# Patient Record
Sex: Female | Born: 1935 | Race: Black or African American | Hispanic: No | Marital: Married | Smoking: Former smoker
Health system: Southern US, Community
[De-identification: ages and names within clinical notes are randomized; demographics above are authoritative.]

## PROBLEM LIST (undated history)

## (undated) DIAGNOSIS — I872 Venous insufficiency (chronic) (peripheral): Secondary | ICD-10-CM

## (undated) DIAGNOSIS — I482 Chronic atrial fibrillation, unspecified: Secondary | ICD-10-CM

## (undated) DIAGNOSIS — G459 Transient cerebral ischemic attack, unspecified: Secondary | ICD-10-CM

## (undated) DIAGNOSIS — I119 Hypertensive heart disease without heart failure: Secondary | ICD-10-CM

## (undated) DIAGNOSIS — E785 Hyperlipidemia, unspecified: Secondary | ICD-10-CM

## (undated) DIAGNOSIS — I10 Essential (primary) hypertension: Secondary | ICD-10-CM

## (undated) DIAGNOSIS — Z952 Presence of prosthetic heart valve: Secondary | ICD-10-CM

## (undated) DIAGNOSIS — I639 Cerebral infarction, unspecified: Secondary | ICD-10-CM

## (undated) DIAGNOSIS — R609 Edema, unspecified: Secondary | ICD-10-CM

## (undated) DIAGNOSIS — I2510 Atherosclerotic heart disease of native coronary artery without angina pectoris: Secondary | ICD-10-CM

## (undated) HISTORY — DX: Hypertensive heart disease without heart failure: I11.9

## (undated) HISTORY — DX: Hyperlipidemia, unspecified: E78.5

## (undated) HISTORY — PX: CARDIAC CATHETERIZATION: SHX172

## (undated) HISTORY — PX: DILATION AND CURETTAGE OF UTERUS: SHX78

## (undated) HISTORY — DX: Presence of prosthetic heart valve: Z95.2

## (undated) HISTORY — PX: MITRAL VALVE REPLACEMENT: SHX147

## (undated) HISTORY — DX: Chronic atrial fibrillation, unspecified: I48.20

## (undated) HISTORY — DX: Transient cerebral ischemic attack, unspecified: G45.9

## (undated) HISTORY — DX: Edema, unspecified: R60.9

## (undated) HISTORY — DX: Venous insufficiency (chronic) (peripheral): I87.2

---

## 1997-12-09 ENCOUNTER — Other Ambulatory Visit: Admission: RE | Admit: 1997-12-09 | Discharge: 1997-12-09 | Payer: Self-pay | Admitting: Obstetrics & Gynecology

## 1999-01-14 ENCOUNTER — Other Ambulatory Visit: Admission: RE | Admit: 1999-01-14 | Discharge: 1999-01-14 | Payer: Self-pay | Admitting: Obstetrics and Gynecology

## 2000-01-05 ENCOUNTER — Other Ambulatory Visit: Admission: RE | Admit: 2000-01-05 | Discharge: 2000-01-05 | Payer: Self-pay | Admitting: Obstetrics and Gynecology

## 2000-05-14 ENCOUNTER — Inpatient Hospital Stay (HOSPITAL_COMMUNITY): Admission: EM | Admit: 2000-05-14 | Discharge: 2000-05-21 | Payer: Self-pay | Admitting: Emergency Medicine

## 2000-05-14 ENCOUNTER — Encounter: Payer: Self-pay | Admitting: Emergency Medicine

## 2000-05-16 ENCOUNTER — Encounter: Payer: Self-pay | Admitting: Neurosurgery

## 2000-05-17 ENCOUNTER — Encounter: Payer: Self-pay | Admitting: Pediatrics

## 2000-05-18 ENCOUNTER — Encounter: Payer: Self-pay | Admitting: Pediatrics

## 2000-05-31 ENCOUNTER — Encounter: Payer: Self-pay | Admitting: *Deleted

## 2000-05-31 ENCOUNTER — Ambulatory Visit (HOSPITAL_COMMUNITY): Admission: RE | Admit: 2000-05-31 | Discharge: 2000-05-31 | Payer: Self-pay | Admitting: *Deleted

## 2001-01-09 ENCOUNTER — Other Ambulatory Visit: Admission: RE | Admit: 2001-01-09 | Discharge: 2001-01-09 | Payer: Self-pay | Admitting: Obstetrics and Gynecology

## 2001-08-15 ENCOUNTER — Emergency Department (HOSPITAL_COMMUNITY): Admission: EM | Admit: 2001-08-15 | Discharge: 2001-08-15 | Payer: Self-pay | Admitting: Emergency Medicine

## 2002-01-10 ENCOUNTER — Other Ambulatory Visit: Admission: RE | Admit: 2002-01-10 | Discharge: 2002-01-10 | Payer: Self-pay | Admitting: Obstetrics and Gynecology

## 2002-11-11 ENCOUNTER — Inpatient Hospital Stay (HOSPITAL_COMMUNITY): Admission: EM | Admit: 2002-11-11 | Discharge: 2002-11-13 | Payer: Self-pay | Admitting: Emergency Medicine

## 2002-11-11 ENCOUNTER — Encounter: Payer: Self-pay | Admitting: Emergency Medicine

## 2002-11-12 ENCOUNTER — Encounter (HOSPITAL_BASED_OUTPATIENT_CLINIC_OR_DEPARTMENT_OTHER): Payer: Self-pay | Admitting: Internal Medicine

## 2002-11-23 ENCOUNTER — Ambulatory Visit (HOSPITAL_COMMUNITY): Admission: RE | Admit: 2002-11-23 | Discharge: 2002-11-23 | Payer: Self-pay | Admitting: Cardiology

## 2003-02-26 ENCOUNTER — Other Ambulatory Visit: Admission: RE | Admit: 2003-02-26 | Discharge: 2003-02-26 | Payer: Self-pay | Admitting: Obstetrics and Gynecology

## 2004-05-12 ENCOUNTER — Other Ambulatory Visit: Admission: RE | Admit: 2004-05-12 | Discharge: 2004-05-12 | Payer: Self-pay | Admitting: Obstetrics and Gynecology

## 2005-06-10 ENCOUNTER — Other Ambulatory Visit: Admission: RE | Admit: 2005-06-10 | Discharge: 2005-06-10 | Payer: Self-pay | Admitting: Obstetrics and Gynecology

## 2011-09-09 ENCOUNTER — Other Ambulatory Visit: Payer: Self-pay | Admitting: Cardiology

## 2011-09-09 ENCOUNTER — Ambulatory Visit
Admission: RE | Admit: 2011-09-09 | Discharge: 2011-09-09 | Disposition: A | Discharge status: Home / Self Care | Payer: Medicare Other | Source: Ambulatory Visit | Attending: Cardiology | Admitting: Cardiology

## 2011-09-09 DIAGNOSIS — R609 Edema, unspecified: Secondary | ICD-10-CM

## 2011-09-28 ENCOUNTER — Ambulatory Visit (INDEPENDENT_AMBULATORY_CARE_PROVIDER_SITE_OTHER): Payer: Medicare Other | Admitting: *Deleted

## 2011-09-28 DIAGNOSIS — R609 Edema, unspecified: Secondary | ICD-10-CM

## 2011-10-01 NOTE — Procedures (Unsigned)
DUPLEX DEEP VENOUS EXAM - LOWER EXTREMITY  INDICATION:  Edema  HISTORY:  Edema:  Bilateral lower edema that fluctuates throughout the course of the day Trauma/Surgery:  No Pain:  No PE:  No Previous DVT:  No Anticoagulants: Other:  DUPLEX EXAM:               CFV   SFV   PopV  PTV    GSV               R  L  R  L  R  L  R   L  R  L Thrombosis    o  o  o  o  o  o  o   o  o  o Spontaneous   +  +  +  +  +  +  +   +  +  + Phasic        +  +  +  +  +  +  +   +  +  + Augmentation  +  +  +  +  +  +  +   +  +  + Compressible  +  +  +  +  +  +  +   +  +  + Competent     o  o  +  o  +  +  +   +  +  +  Legend:  + - yes  o - no  p - partial  D - decreased  IMPRESSION: 1. No evidence of deep or superficial vein thrombosis noted in the     bilateral lower extremities. 2. Reflux of >500 milliseconds noted in the bilateral common femoral     and left superficial femoral veins.   _____________________________ Janetta Hora Fields, MD  CH/MEDQ  D:  09/28/2011  T:  09/28/2011  Job:  [id number]

## 2013-05-12 IMAGING — CR DG CHEST 2V
2 series · 2 of 2 positions shown · non-contrast
Comparison: None.

CLINICAL DATA: Heart surgery 9667.  Possible pulmonary edema.

CHEST - 2 VIEW

[view not recorded (1 of 2)]
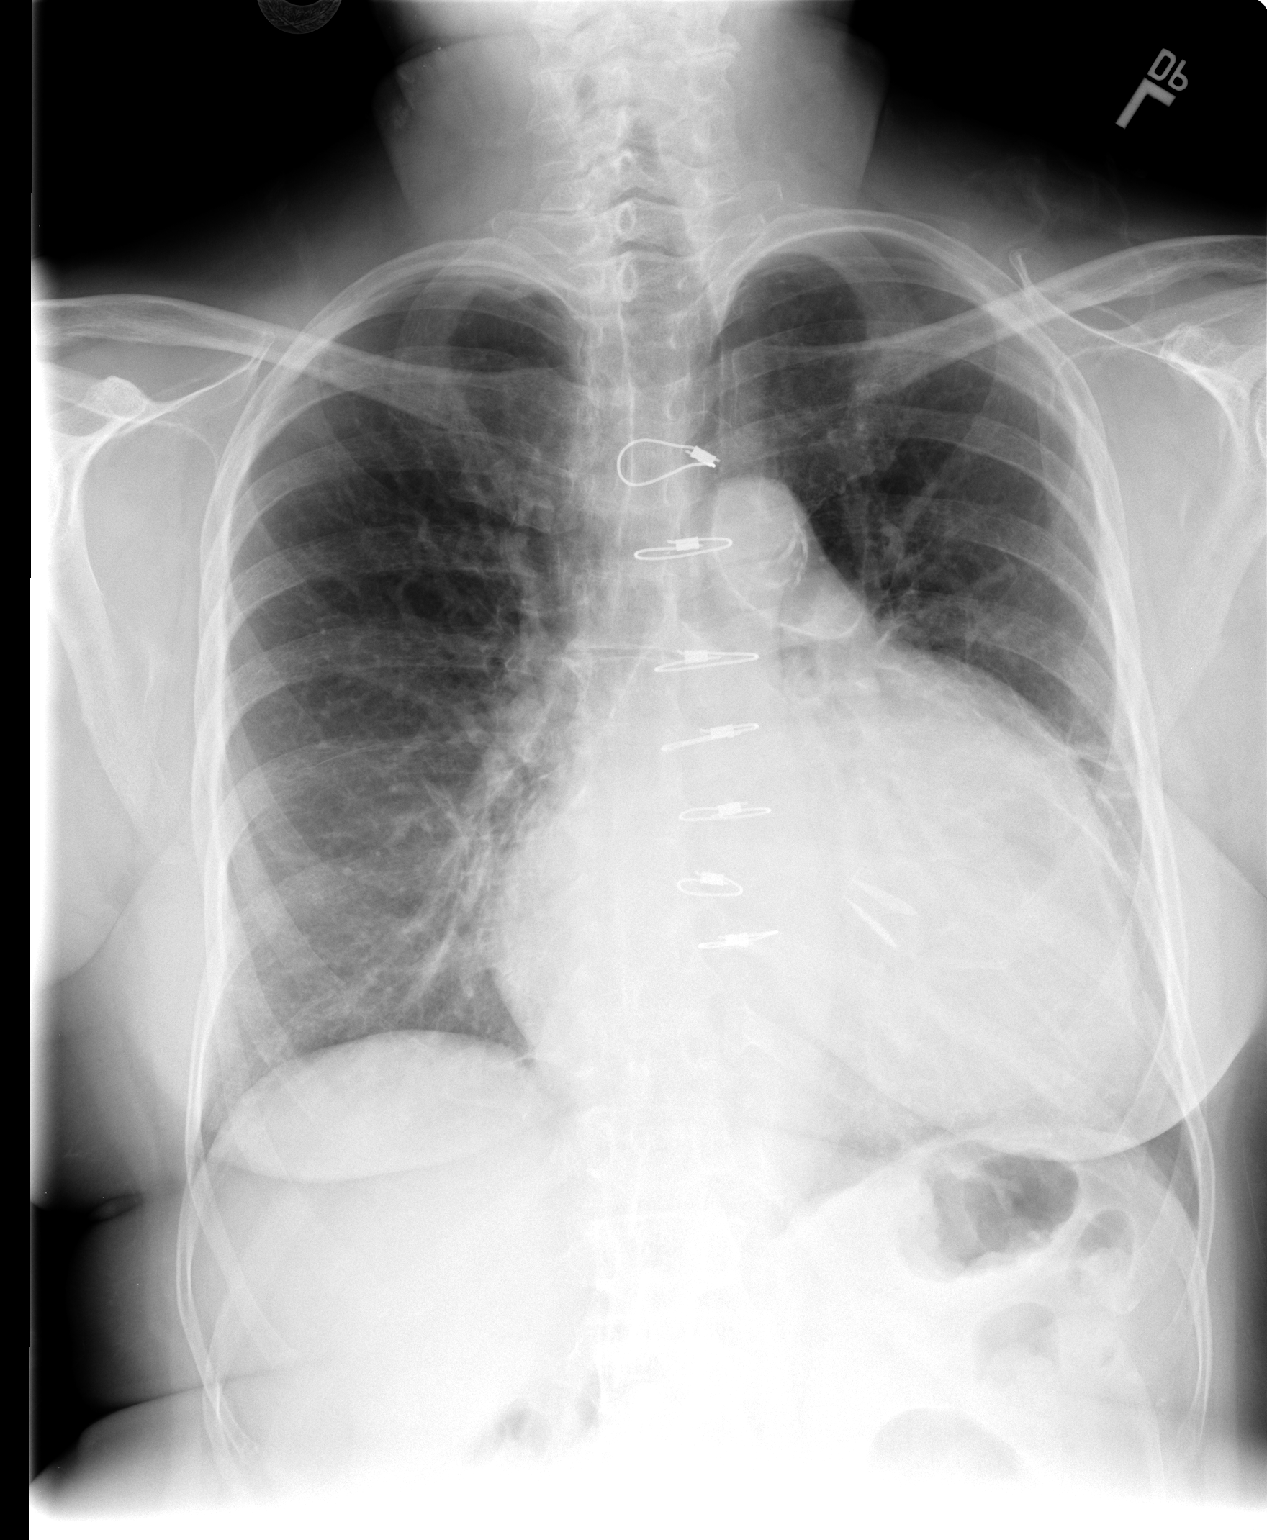

[view not recorded (2 of 2)]
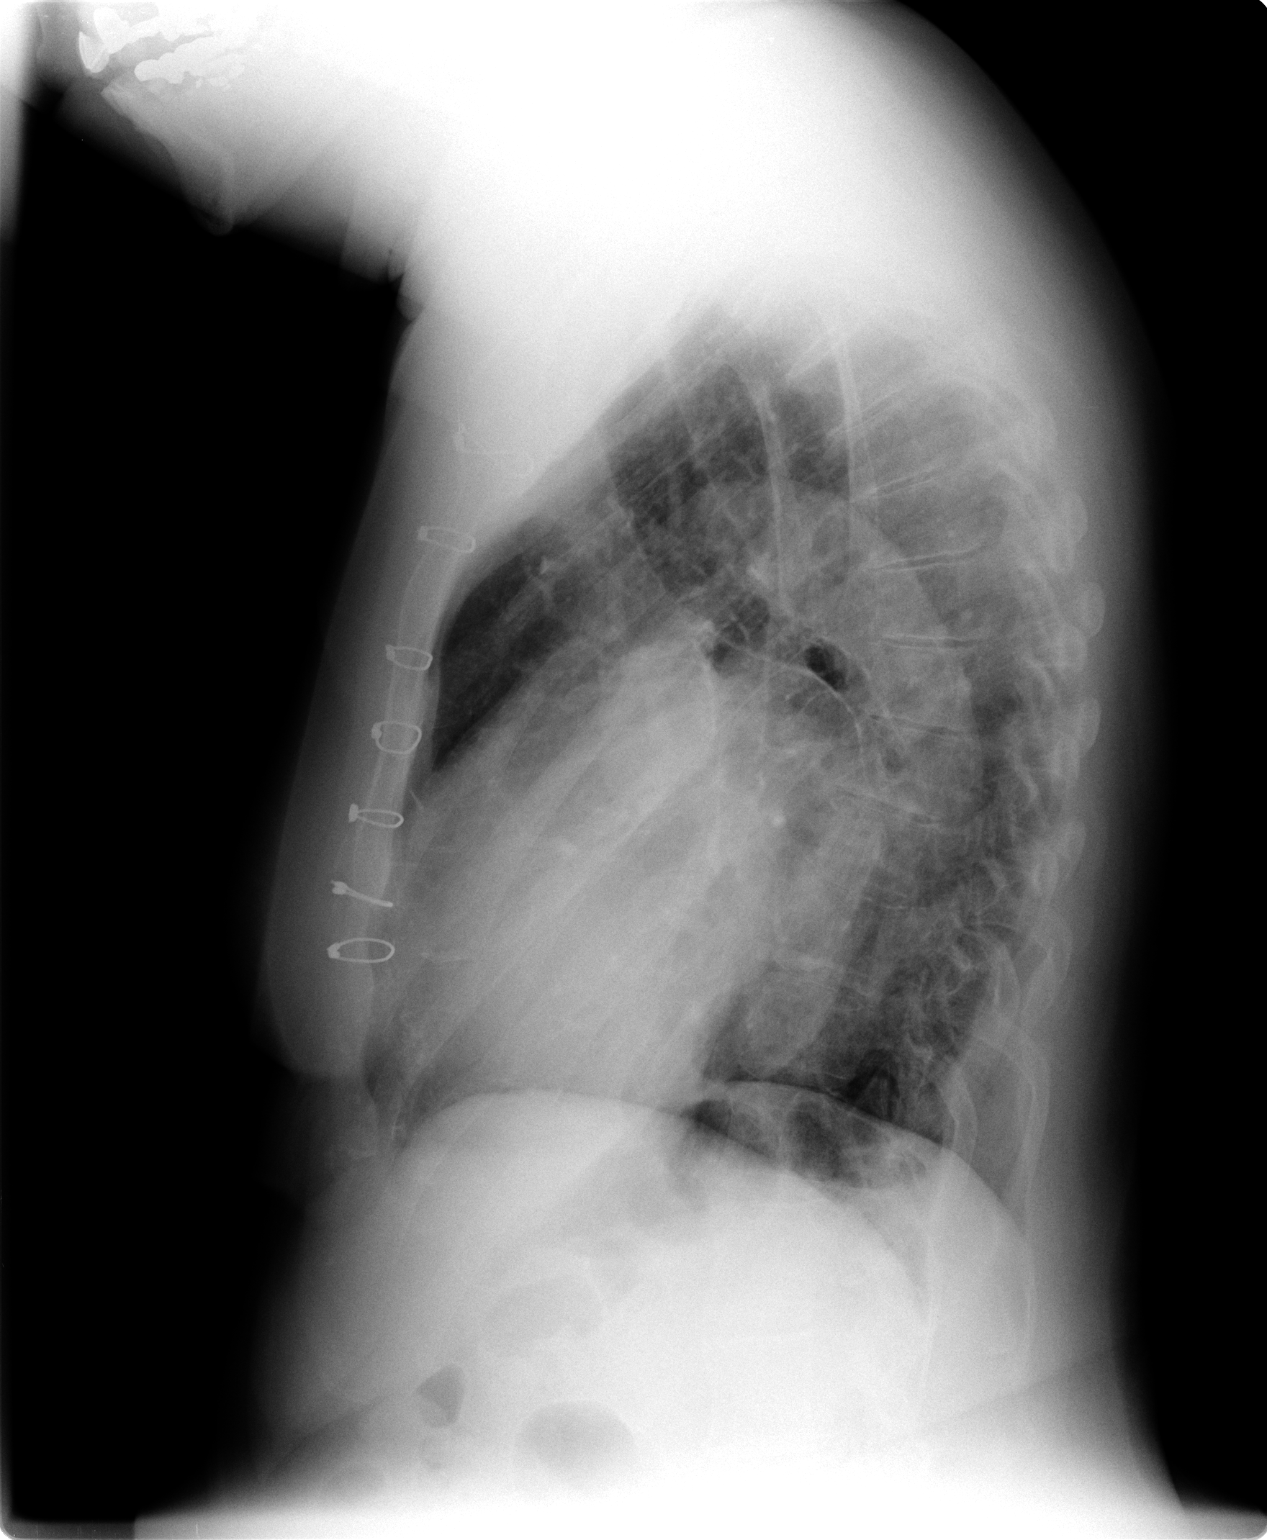

[2 of 2 positions shown; findings below may reference images not displayed]

FINDINGS: Prior median sternotomy.  Lateral view is mildly oblique.

Midline trachea.  Moderate to marked global cardiomegaly.
Mediastinal contours otherwise within normal limits.  No pleural
effusion or pneumothorax.  No lobar consolidation.  Mild lower lobe
predominant pulmonary interstitial thickening.  More focal scarring
in the left midlung on the frontal view laterally.
IMPRESSION: Moderate to marked cardiomegaly.  Mild lower lobe predominant
interstitial thickening, suspicious for pulmonary venous
congestion.  No overt congestive failure.

## 2016-11-29 ENCOUNTER — Encounter: Payer: Self-pay | Admitting: Cardiology

## 2018-03-28 ENCOUNTER — Other Ambulatory Visit: Payer: Self-pay

## 2018-03-28 DIAGNOSIS — E785 Hyperlipidemia, unspecified: Secondary | ICD-10-CM

## 2018-03-28 DIAGNOSIS — Z952 Presence of prosthetic heart valve: Secondary | ICD-10-CM

## 2018-03-28 DIAGNOSIS — R609 Edema, unspecified: Secondary | ICD-10-CM

## 2018-03-28 DIAGNOSIS — I872 Venous insufficiency (chronic) (peripheral): Secondary | ICD-10-CM

## 2018-03-28 DIAGNOSIS — I119 Hypertensive heart disease without heart failure: Secondary | ICD-10-CM

## 2018-03-28 DIAGNOSIS — I482 Chronic atrial fibrillation, unspecified: Secondary | ICD-10-CM

## 2018-03-28 DIAGNOSIS — G459 Transient cerebral ischemic attack, unspecified: Secondary | ICD-10-CM | POA: Insufficient documentation

## 2018-03-28 HISTORY — DX: Hyperlipidemia, unspecified: E78.5

## 2018-03-28 HISTORY — DX: Chronic atrial fibrillation, unspecified: I48.20

## 2018-03-28 HISTORY — DX: Venous insufficiency (chronic) (peripheral): I87.2

## 2018-03-28 HISTORY — DX: Presence of prosthetic heart valve: Z95.2

## 2018-03-28 HISTORY — DX: Edema, unspecified: R60.9

## 2018-03-28 HISTORY — DX: Transient cerebral ischemic attack, unspecified: G45.9

## 2018-03-28 HISTORY — DX: Hypertensive heart disease without heart failure: I11.9

## 2018-05-21 NOTE — Progress Notes (Signed)
Cardiology Office Note:    Date:  05/22/2018   ID:  Miranda Parsons, DOB 11/23/1934, MRN [id number]  PCP:  Jarome MatinPaterson, Daniel, MD  Cardiologist:  Norman HerrlichBrian Gregery Walberg, MD    Referring MD: Jarome MatinPaterson, Daniel, MD    ASSESSMENT:    1. Chronic atrial fibrillation   2. Chronic anticoagulation   3. CAD in native artery   4. Hypertensive heart disease without heart failure   5. Pure hypercholesterolemia    PLAN:    In order of problems listed above:  1. Stable asymptomatic continue beta-blocker and warfarin INR goal 3.0 2. Stable continue warfarin with Saint Jude MVR managed with her PCP 3. Stable she has had no anginal discomfort I would not pursue an ischemia evaluation and continue current treatment including beta-blocker warfarin and high intensity statin 4. Stable controlled with home measurements continue current treatment with her loop diuretic and beta-blocker. 5. Stable lipids are ideal continue her current high intensity statin.  Labs for toxicity liver function follow-up with her PCP   Next appointment: 6 months Dr. Donnie Ahoilley   Medication Adjustments/Labs and Tests Ordered: Current medicines are reviewed at length with the patient today.  Concerns regarding medicines are outlined above.  Orders Placed This Encounter  Procedures  . EKG 12-Lead   No orders of the defined types were placed in this encounter.   Chief Complaint  Patient presents with  . Follow-up  . Atrial Fibrillation  . Anticoagulation    History of Present Illness:    Miranda Parsons is a 83 y.o. female with a hx of chronic atrial fibrillation on warfarin with Saint Jude mitral valve for rheumatic mitral valve disease hypertension without heart failure although she takes a diuretic CAD and hyper lipidemia.[name].  She was last seen Dr. Donnie Ahoilley 6 months ago. Compliance with diet, lifestyle and medications: Yes  She has had a frustrating day and started off going to a new doctor new office and had a flat tire her  blood pressure is elevated but she tells me excellent range at home.  She is unaware of atrial fibrillation occasionally his valve clicking has had no shortness of breath chest pain palpitation TIA or bleeding complication of her anticoagulant she has intermittent dependent edema sodium restriction takes a loop diuretic.  INR is followed with her PCP and her last and last month was 2.6.  Lipid panel 01/13/2018 shows a cholesterol 138 HDL 50 LDL 78 renal function normal. Past Medical History:  Diagnosis Date  . Chronic atrial fibrillation 03/28/2018  . Edema 03/28/2018  . Hyperlipidemia 03/28/2018  . Hypertensive heart disease without heart failure 03/28/2018  . Presence of prosthetic heart valve 03/28/2018  . TIA (transient ischemic attack) 03/28/2018  . Venous insufficiency 03/28/2018    Past Surgical History:  Procedure Laterality Date  . CARDIAC CATHETERIZATION    . DILATION AND CURETTAGE OF UTERUS    . MITRAL VALVE REPLACEMENT      Current Medications: Current Meds  Medication Sig  . alendronate (FOSAMAX) 70 MG tablet Take 70 mg by mouth once a week. Take with a full glass of water on an empty stomach.  [name]. amoxicillin (AMOXIL) 500 MG tablet Take 500 mg by mouth 2 (two) times daily. Take prior to dental procedures  . atenolol (TENORMIN) 25 MG tablet Take 1 tablet by mouth daily.  [name]. atorvastatin (LIPITOR) 80 MG tablet Take 80 mg by mouth daily.  . Calcium 500-125 MG-UNIT TABS Take 0.5 tablets by mouth daily.   [name]. docusate  sodium (COLACE) 100 MG capsule Take 100 mg by mouth 2 (two) times daily.  . ergocalciferol (VITAMIN D2) 50000 units capsule Take 50,000 Units by mouth once a week.  . furosemide (LASIX) 40 MG tablet Take 40 mg by mouth daily.   . Horse Chestnut 300 MG CAPS Take 1 capsule by mouth daily.  [name] losartan (COZAAR) 50 MG tablet Take 50 mg by mouth daily.  . potassium chloride (K-DUR) 10 MEQ tablet Take 10 mEq by mouth 2 (two) times daily.   . vitamin B-12 (CYANOCOBALAMIN) 500  MCG tablet Take 500 mcg by mouth daily.  [name] warfarin (COUMADIN) 3 MG tablet Take 3 mg by mouth daily.     Allergies:   Patient has no known allergies.   Social History   Socioeconomic History  . Marital status: Married    Spouse name: Not on file  . Number of children: Not on file  . Years of education: Not on file  . Highest education level: Not on file  Occupational History  . Not on file  Social Needs  . Financial resource strain: Not on file  . Food insecurity:    Worry: Not on file    Inability: Not on file  . Transportation needs:    Medical: Not on file    Non-medical: Not on file  Tobacco Use  . Smoking status: Former Smoker    Packs/day: 0.50    Years: 15.00    Pack years: 7.50    Types: Cigarettes    Last attempt to quit: 1980    Years since quitting: 39.9  . Smokeless tobacco: Never Used  Substance and Sexual Activity  . Alcohol use: Not Currently  . Drug use: Never  . Sexual activity: Not on file  Lifestyle  . Physical activity:    Days per week: Not on file    Minutes per session: Not on file  . Stress: Not on file  Relationships  . Social connections:    Talks on phone: Not on file    Gets together: Not on file    Attends religious service: Not on file    Active member of club or organization: Not on file    Attends meetings of clubs or organizations: Not on file    Relationship status: Not on file  Other Topics Concern  . Not on file  Social History Narrative  . Not on file     Family History: The patient's family history includes Coronary artery disease in her father; Diabetes in her brother; Heart Problems in her mother. ROS:   Please see the history of present illness.    All other systems reviewed and are negative.  EKGs/Labs/Other Studies Reviewed:    The following studies were reviewed today:  EKG:  EKG ordered today.  The ekg ordered today demonstrates atrial fibrillation controlled ventricular rate  Recent Labs: No results  found for requested labs within last 8760 hours.  Recent Lipid Panel No results found for: CHOL, TRIG, HDL, CHOLHDL, VLDL, LDLCALC, LDLDIRECT  Physical Exam:    VS:  BP (!) 158/86 (BP Location: Right Arm, Patient Position: Sitting, Cuff Size: Normal)   Pulse 69   Ht 5\' 4"  (1.626 m)   Wt 167 lb 12.8 oz (76.1 kg)   SpO2 97%   BMI 28.80 kg/m     Wt Readings from Last 3 Encounters:  05/22/18 167 lb 12.8 oz (76.1 kg)     GEN:  Well nourished, well developed in no acute  distress HEENT: Normal NECK: No JVD; No carotid bruits LYMPHATICS: No lymphadenopathy CARDIAC: Irregular rhythm variable first heart sound sharp closing sound Saint Jude MVR no murmur RESPIRATORY:  Clear to auscultation without rales, wheezing or rhonchi  ABDOMEN: Soft, non-tender, non-distended MUSCULOSKELETAL: 1+ bilateral lower extremity edema; No deformity  SKIN: Warm and dry NEUROLOGIC:  Alert and oriented x 3 PSYCHIATRIC:  Normal affect    Signed, Norman Herrlich, MD  05/22/2018 10:37 AM    [facility]

## 2018-05-22 ENCOUNTER — Encounter: Payer: Self-pay | Admitting: Cardiology

## 2018-05-22 ENCOUNTER — Ambulatory Visit (INDEPENDENT_AMBULATORY_CARE_PROVIDER_SITE_OTHER): Payer: Medicare Other | Admitting: Cardiology

## 2018-05-22 VITALS — BP 158/86 | HR 69 | Ht 64.0 in | Wt 167.8 lb

## 2018-05-22 DIAGNOSIS — Z7901 Long term (current) use of anticoagulants: Secondary | ICD-10-CM | POA: Diagnosis not present

## 2018-05-22 DIAGNOSIS — I482 Chronic atrial fibrillation, unspecified: Secondary | ICD-10-CM | POA: Diagnosis not present

## 2018-05-22 DIAGNOSIS — I2510 Atherosclerotic heart disease of native coronary artery without angina pectoris: Secondary | ICD-10-CM | POA: Diagnosis not present

## 2018-05-22 DIAGNOSIS — I119 Hypertensive heart disease without heart failure: Secondary | ICD-10-CM

## 2018-05-22 DIAGNOSIS — E7800 Pure hypercholesterolemia, unspecified: Secondary | ICD-10-CM

## 2018-05-22 NOTE — Patient Instructions (Signed)

## 2018-12-27 ENCOUNTER — Encounter: Payer: Self-pay | Admitting: Cardiology

## 2018-12-28 ENCOUNTER — Encounter: Payer: Self-pay | Admitting: Cardiology

## 2018-12-28 ENCOUNTER — Other Ambulatory Visit: Payer: Self-pay

## 2018-12-28 ENCOUNTER — Ambulatory Visit: Payer: Medicare Other | Admitting: Cardiology

## 2018-12-28 VITALS — BP 188/81 | HR 67 | Ht 65.0 in | Wt 164.0 lb

## 2018-12-28 DIAGNOSIS — I482 Chronic atrial fibrillation, unspecified: Secondary | ICD-10-CM | POA: Diagnosis not present

## 2018-12-28 DIAGNOSIS — I10 Essential (primary) hypertension: Secondary | ICD-10-CM

## 2018-12-28 DIAGNOSIS — Z952 Presence of prosthetic heart valve: Secondary | ICD-10-CM | POA: Diagnosis not present

## 2018-12-28 MED ORDER — LOSARTAN POTASSIUM 100 MG PO TABS: 100.0000 mg | ORAL_TABLET | Freq: Every day | ORAL | 3 refills | Status: DC

## 2018-12-28 NOTE — Progress Notes (Signed)
Patient referred by Leanna Battles, MD for rheumatic mitral valve disease s/p mitral valve replacement  Subjective:   Miranda Parsons, female    DOB: April 16, 1935, 84 y.o.   MRN: [id number]   Chief Complaint  Patient presents with  . Atrial Fibrillation  . New Patient (Initial Visit)   HPI  84 y.o. AA female with PMH sig for Rheumatic Fever, Permanent Valvular Afib,  HTN, HLD, mitral valve prolapse s/p valve replacement with st jude mechanical valve, hemorrhagic basal ganglia cva in 2001, Osteoporosis, R cerebellar venous infarct  Patient states she is doing very well, she has been less active since her church has decreased functions due to covid 26.  She denies any chest pain or shortness of breath.  Her activities of daily living are not limited.  She is unsure what her blood pressure was at her visit with her PCP however states it usually runs in the 818'E systolic.     Past Medical History:  Diagnosis Date  . Chronic atrial fibrillation 03/28/2018  . Edema 03/28/2018  . Hyperlipidemia 03/28/2018  . Hypertensive heart disease without heart failure 03/28/2018  . Presence of prosthetic heart valve 03/28/2018  . TIA (transient ischemic attack) 03/28/2018  . Venous insufficiency 03/28/2018     Past Surgical History:  Procedure Laterality Date  . CARDIAC CATHETERIZATION    . DILATION AND CURETTAGE OF UTERUS    . MITRAL VALVE REPLACEMENT       Social History   Socioeconomic History  . Marital status: Married    Spouse name: Not on file  . Number of children: 1  . Years of education: Not on file  . Highest education level: Not on file  Occupational History  . Not on file  Social Needs  . Financial resource strain: Not on file  . Food insecurity    Worry: Not on file    Inability: Not on file  . Transportation needs    Medical: Not on file    Non-medical: Not on file  Tobacco Use  . Smoking status: Former Smoker    Packs/day: 0.25    Years: 15.00    Pack  years: 3.75    Types: Cigarettes    Quit date: 1980    Years since quitting: 40.5  . Smokeless tobacco: Never Used  Substance and Sexual Activity  . Alcohol use: Not Currently  . Drug use: Never  . Sexual activity: Not on file  Lifestyle  . Physical activity    Days per week: Not on file    Minutes per session: Not on file  . Stress: Not on file  Relationships  . Social Herbalist on phone: Not on file    Gets together: Not on file    Attends religious service: Not on file    Active member of club or organization: Not on file    Attends meetings of clubs or organizations: Not on file    Relationship status: Not on file  . Intimate partner violence    Fear of current or ex partner: Not on file    Emotionally abused: Not on file    Physically abused: Not on file    Forced sexual activity: Not on file  Other Topics Concern  . Not on file  Social History Narrative  . Not on file   Orthostatic VS for the past 72 hrs (Last 3 readings):  Patient Position BP Location Cuff Size  12/28/18 1337 Sitting Right Arm  Small    Family History  Problem Relation Age of Onset  . Heart Problems Mother   . Coronary artery disease Father   . Diabetes Brother   . Hypertension Sister      Current Outpatient Medications on File Prior to Visit  Medication Sig Dispense Refill  . atenolol (TENORMIN) 25 MG tablet Take 1 tablet by mouth daily.    [name]. atorvastatin (LIPITOR) 80 MG tablet Take 80 mg by mouth daily.    . Calcium 500-125 MG-UNIT TABS Take 0.5 tablets by mouth daily.     [name]. docusate sodium (COLACE) 100 MG capsule Take 100 mg by mouth daily.    . ergocalciferol (VITAMIN D2) 50000 units capsule Take 50,000 Units by mouth once a week.    . furosemide (LASIX) 40 MG tablet Take 40 mg by mouth daily.     . Horse Chestnut 300 MG CAPS Take 1 capsule by mouth daily.    . potassium chloride (K-DUR) 10 MEQ tablet Take 10 mEq by mouth 2 (two) times daily.     . vitamin B-12  (CYANOCOBALAMIN) 500 MCG tablet Take 500 mcg by mouth once a week.    . warfarin (COUMADIN) 3 MG tablet Take 3 mg by mouth daily. Monday 3 1/2     No current facility-administered medications on file prior to visit.     Cardiovascular studies:  05/2018  ECG: Afib w/normal rate, LVH, prolonged QT interval  EKG 12/28/2018:  Atrial fibrillation with controlled ventricular rate.  Nonspecific ST-T changes.   Recent labs:  12/2018  Na 142, K 4.3, Cr 1.3, GFR 47.4  WBC 3.93, MCV 104.9, Hgb 12.4  TG 107, HDL 63, LDL 81  INR 2.6   Review of Systems  Constitution: Negative for decreased appetite and fever.  HENT: Negative for sore throat and stridor.   Eyes: Negative for pain and visual disturbance.  Cardiovascular: Negative for chest pain, claudication and dyspnea on exertion.  Respiratory: Negative for cough and shortness of breath.   Endocrine: Negative for cold intolerance and heat intolerance.  Hematologic/Lymphatic: Negative for bleeding problem.  Skin: Negative for dry skin and flushing.  Musculoskeletal: Negative for muscle cramps and muscle weakness.  Gastrointestinal: Negative for abdominal pain, anorexia, nausea and vomiting.  Genitourinary: Negative for dysuria and flank pain.  Neurological: Negative for focal weakness and weakness.  Psychiatric/Behavioral: Negative for altered mental status. The patient does not have insomnia.   Allergic/Immunologic: Negative for persistent infections.         Vitals:   12/28/18 1337  BP: (!) 188/81  Pulse: 67  SpO2: 99%     Body mass index is 27.29 kg/m. Filed Weights   12/28/18 1337  Weight: 164 lb (74.4 kg)     Objective:   Physical Exam  Constitutional: She is oriented to person, place, and time. She appears well-developed and well-nourished.  HENT:  Head: Normocephalic and atraumatic.  Eyes: Right eye exhibits no discharge. Left eye exhibits no discharge.  Cardiovascular: Normal rate and normal pulses. An  irregularly irregular rhythm present.  Mechanical S2 appreciated  Pulmonary/Chest: Effort normal and breath sounds normal. She has no wheezes. She has no rales.  Abdominal: She exhibits no distension.  Musculoskeletal:        General: Edema (trace LE edema bilaterally pt wearing compression stockings) present.  Neurological: She is alert and oriented to person, place, and time.  Skin: Skin is warm and dry.          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  She has been doing well since then, very functional with no limit in daily activities.  Physical exam today reassuring. She is unsure when her last ECHO was but is sure it has been at least a year. She did have a hemorrhagic basal ganglia stroke, she is no longer on aspirin.  Last INR within range at 2.6.  She follows up with her PCP who manages her warfarin.    -will repeat ECHO -follow up to go over results in 2-3 weeks  HTN: initial and repeat BP elevated here in the office today.  She reports a baseline systolic bp of around 140.    -given her history I would favor tighter blood pressure control, will increase losartan to 100mg  daily  -follow up with labs in ~ 1 week and will schedule ECHO and office visit on the same day for follow up ~2-3 weeks  Permanent Valvular Afib: currently on atenolol 25mg  and warfarin.  She is rate controlled today and asymptomatic.    -continue atenolol and warfarin  Thank you for referring the patient to us. Please feel free to contact with any questions.  Thornell MuleBrandon Sejla Marzano MD PGY-3 Internal Medicine

## 2018-12-29 ENCOUNTER — Encounter: Payer: Self-pay | Admitting: Cardiology

## 2019-01-05 LAB — BASIC METABOLIC PANEL
BUN/Creatinine Ratio: 9 — ABNORMAL LOW (ref 12–28)
BUN: 10 mg/dL (ref 8–27)
CO2: 23 mmol/L (ref 20–29)
Calcium: 9.7 mg/dL (ref 8.7–10.3)
Chloride: 102 mmol/L (ref 96–106)
Creatinine, Ser: 1.1 mg/dL — ABNORMAL HIGH (ref 0.57–1.00)
GFR calc Af Amer: 54 mL/min/{1.73_m2} — ABNORMAL LOW (ref >59)
GFR calc non Af Amer: 47 mL/min/{1.73_m2} — ABNORMAL LOW (ref >59)
Glucose: 97 mg/dL (ref 65–99)
Potassium: 4.1 mmol/L (ref 3.5–5.2)
Sodium: 142 mmol/L (ref 134–144)

## 2019-01-06 ENCOUNTER — Other Ambulatory Visit: Payer: Self-pay

## 2019-01-06 DIAGNOSIS — Z20822 Contact with and (suspected) exposure to covid-19: Secondary | ICD-10-CM

## 2019-01-09 LAB — NOVEL CORONAVIRUS, NAA: SARS-CoV-2, NAA: NOT DETECTED

## 2019-01-19 ENCOUNTER — Ambulatory Visit (INDEPENDENT_AMBULATORY_CARE_PROVIDER_SITE_OTHER): Payer: Medicare Other

## 2019-01-19 ENCOUNTER — Encounter: Payer: Self-pay | Admitting: Cardiology

## 2019-01-19 ENCOUNTER — Other Ambulatory Visit: Payer: Self-pay

## 2019-01-19 ENCOUNTER — Ambulatory Visit (INDEPENDENT_AMBULATORY_CARE_PROVIDER_SITE_OTHER): Payer: Medicare Other | Admitting: Cardiology

## 2019-01-19 VITALS — BP 177/71 | HR 67 | Ht 65.0 in | Wt 163.9 lb

## 2019-01-19 DIAGNOSIS — I10 Essential (primary) hypertension: Secondary | ICD-10-CM | POA: Diagnosis not present

## 2019-01-19 DIAGNOSIS — M7989 Other specified soft tissue disorders: Secondary | ICD-10-CM | POA: Diagnosis not present

## 2019-01-19 DIAGNOSIS — Z952 Presence of prosthetic heart valve: Secondary | ICD-10-CM

## 2019-01-19 MED ORDER — LOSARTAN POTASSIUM 100 MG PO TABS: 100.0000 mg | ORAL_TABLET | Freq: Every day | ORAL | 3 refills | Status: DC

## 2019-01-19 MED ORDER — SPIRONOLACTONE 25 MG PO TABS
25.0000 mg | ORAL_TABLET | Freq: Every day | ORAL | 3 refills | Status: DC
Start: 2019-01-19 — End: 2019-04-06

## 2019-01-19 NOTE — Progress Notes (Signed)
Follow up visit  Subjective:   Miranda Parsons, female    DOB: 10/25/1934, 84 y.o.   MRN: [id number]   Chief Complaint  Patient presents with  . MVR  . Results    echo  . Follow-up    HPI  84 y/o [place] female with h/o rheumatic mitral valve disease, s/p mechanical valve replacement (1989), hypertension, venous insufficiency.  Echocardiogram today showed normal functioning of mitral valve, severe biatrial enlargement-not new, mild pulmonary hypertension and small posterior pericardial effusion. Patient's blood pressure remains elevated. She is tolerating losartan 100 mg well. She complains of left leg swelling.   Past Medical History:  Diagnosis Date  . Chronic atrial fibrillation 03/28/2018  . Edema 03/28/2018  . Hyperlipidemia 03/28/2018  . Hypertensive heart disease without heart failure 03/28/2018  . Presence of prosthetic heart valve 03/28/2018  . TIA (transient ischemic attack) 03/28/2018  . Venous insufficiency 03/28/2018     Past Surgical History:  Procedure Laterality Date  . CARDIAC CATHETERIZATION    . DILATION AND CURETTAGE OF UTERUS    . MITRAL VALVE REPLACEMENT       Social History   Socioeconomic History  . Marital status: Married    Spouse name: Not on file  . Number of children: 1  . Years of education: Not on file  . Highest education level: Not on file  Occupational History  . Not on file  Social Needs  . Financial resource strain: Not on file  . Food insecurity    Worry: Not on file    Inability: Not on file  . Transportation needs    Medical: Not on file    Non-medical: Not on file  Tobacco Use  . Smoking status: Former Smoker    Packs/day: 0.25    Years: 15.00    Pack years: 3.75    Types: Cigarettes    Quit date: 1980    Years since quitting: 40.6  . Smokeless tobacco: Never Used  Substance and Sexual Activity  . Alcohol use: Not Currently  . Drug use: Never  . Sexual activity: Not on file  Lifestyle  .  Physical activity    Days per week: Not on file    Minutes per session: Not on file  . Stress: Not on file  Relationships  . Social Musicianconnections    Talks on phone: Not on file    Gets together: Not on file    Attends religious service: Not on file    Active member of club or organization: Not on file    Attends meetings of clubs or organizations: Not on file    Relationship status: Not on file  . Intimate partner violence    Fear of current or ex partner: Not on file    Emotionally abused: Not on file    Physically abused: Not on file    Forced sexual activity: Not on file  Other Topics Concern  . Not on file  Social History Narrative  . Not on file     Family History  Problem Relation Age of Onset  . Heart Problems Mother   . Coronary artery disease Father   . Diabetes Brother   . Hypertension Sister      Current Outpatient Medications on File Prior to Visit  Medication Sig Dispense Refill  . atenolol (TENORMIN) 25 MG tablet Take 1 tablet by mouth daily.    [name]. atorvastatin (LIPITOR) 80 MG tablet Take 80 mg by mouth daily.    .[name]  Calcium 500-125 MG-UNIT TABS Take 0.5 tablets by mouth daily.     [name] docusate sodium (COLACE) 100 MG capsule Take 100 mg by mouth daily.    . ergocalciferol (VITAMIN D2) 50000 units capsule Take 50,000 Units by mouth once a week.    . furosemide (LASIX) 40 MG tablet Take 40 mg by mouth daily.     . Horse Chestnut 300 MG CAPS Take 1 capsule by mouth daily.    [name] losartan (COZAAR) 100 MG tablet Take 1 tablet (100 mg total) by mouth daily. 30 tablet 3  . potassium chloride (K-DUR) 10 MEQ tablet Take 10 mEq by mouth 2 (two) times daily.     . vitamin B-12 (CYANOCOBALAMIN) 500 MCG tablet Take 500 mcg by mouth once a week.    . warfarin (COUMADIN) 3 MG tablet Take 3 mg by mouth daily. Monday 3 1/2     No current facility-administered medications on file prior to visit.     Cardiovascular studies:  Echocardiogram 01/19/2019: Left ventricle cavity is  normal in size. Abnormal septal wall motion due to post-operative valve. Normal LV systolic function with EF 59%. Diastolic function not assessed due to atrial fibrillation and post-op status.  Calculated EF 59%. Left atrial cavity is severely dilated. Right atrial cavity is severely dilated. Mild (Grade I) aortic regurgitation. Well seated mechanical mitral valve with normal excursion.  Mean PG 3 mmHg, which is normal.  No mitral valve regurgitation noted. Dilation of the tricuspid valve annulus. Moderate tricuspid regurgitation. Estimated pulmonary artery systolic pressure is 44 mmHg. Mild pulmonic regurgitation. Small, posterior pericardial effusion with no hemodynamic compromise. Compared to previous study  in 203, small pericardial effusion is new. No other changes noted.   EKG 12/28/2018: Atrial fibrillation with controlled ventricular rate. Nonspecific ST-T changes.   Recent labs: Results for CELENIA, HRUSKA (MRN [id number]) as of 01/19/2019 11:05  Ref. Range 01/04/2019 40:08  BASIC METABOLIC PANEL Unknown Rpt (A)  Sodium Latest Ref Range: 134 - 144 mmol/L 142  Potassium Latest Ref Range: 3.5 - 5.2 mmol/L 4.1  Chloride Latest Ref Range: 96 - 106 mmol/L 102  CO2 Latest Ref Range: 20 - 29 mmol/L 23  Glucose Latest Ref Range: 65 - 99 mg/dL 97  BUN Latest Ref Range: 8 - 27 mg/dL 10  Creatinine Latest Ref Range: 0.57 - 1.00 mg/dL 1.10 (H)  Calcium Latest Ref Range: 8.7 - 10.3 mg/dL 9.7  BUN/Creatinine Ratio Latest Ref Range: 12 - 28  9 (L)  GFR, Est Non African American Latest Ref Range: >59 mL/min/1.73 47 (L)  GFR, Est African American Latest Ref Range: >59 mL/min/1.73 54 (L)     Review of Systems  Constitution: Negative for decreased appetite, malaise/fatigue, weight gain and weight loss.  HENT: Negative for congestion.   Eyes: Negative for visual disturbance.  Cardiovascular: Positive for leg swelling. Negative for chest pain, dyspnea on exertion, palpitations and syncope.   Respiratory: Negative for cough.   Endocrine: Negative for cold intolerance.  Hematologic/Lymphatic: Does not bruise/bleed easily.  Skin: Negative for itching and rash.  Musculoskeletal: Negative for myalgias.  Gastrointestinal: Negative for abdominal pain, nausea and vomiting.  Genitourinary: Negative for dysuria.  Neurological: Negative for dizziness and weakness.  Psychiatric/Behavioral: The patient is not nervous/anxious.   All other systems reviewed and are negative.        Vitals:   01/19/19 1156  BP: (!) 177/71  Pulse: 67  SpO2: 97%    Body mass index is 27.27 kg/m. Filed Weights  01/19/19 1156  Weight: 163 lb 14.4 oz (74.3 kg)     Objective:   Physical Exam  Constitutional: She is oriented to person, place, and time. She appears well-developed and well-nourished. No distress.  HENT:  Head: Normocephalic and atraumatic.  Eyes: Pupils are equal, round, and reactive to light. Conjunctivae are normal.  Neck: No JVD present.  Cardiovascular: Normal rate, regular rhythm and intact distal pulses.  Metallic S1  Pulmonary/Chest: Effort normal and breath sounds normal. She has no wheezes. She has no rales.  Abdominal: Soft. Bowel sounds are normal. There is no rebound.  Musculoskeletal:        General: No edema.  Lymphadenopathy:    She has no cervical adenopathy.  Neurological: She is alert and oriented to person, place, and time. No cranial nerve deficit.  Skin: Skin is warm and dry.  Psychiatric: She has a normal mood and affect.  Nursing note and vitals reviewed.         Assessment & Recommendations:   84 y/o [place] female with h/o rheumatic mitral valve disease, s/p mechanical valve replacement (1989), hypertension, venous insufficiency.  Hypertension: Uncontrolled. Added Spironolactone 25 mg daily. Check BMP in 1 week.  Left leg swelling: .Will check ultrasound to rule put DVT>  Mild PH, small pericardial effusion: Will monitor for  now. S/p mechanical MVR with normal function.   F/u for BP check in 2 weeks. F?u w/me in 6 months.    Elder NegusManish J Jerrell Hart, MD [facility] Pager: [phone] Office: [phone] If no answer Cell [phone]

## 2019-01-23 ENCOUNTER — Other Ambulatory Visit: Payer: Self-pay

## 2019-01-23 ENCOUNTER — Ambulatory Visit (INDEPENDENT_AMBULATORY_CARE_PROVIDER_SITE_OTHER): Payer: Medicare Other

## 2019-01-23 DIAGNOSIS — M7989 Other specified soft tissue disorders: Secondary | ICD-10-CM | POA: Diagnosis not present

## 2019-01-26 ENCOUNTER — Other Ambulatory Visit (HOSPITAL_COMMUNITY): Payer: Self-pay | Admitting: Cardiology

## 2019-01-27 LAB — BASIC METABOLIC PANEL
BUN/Creatinine Ratio: 8 — ABNORMAL LOW (ref 12–28)
BUN: 10 mg/dL (ref 8–27)
CO2: 25 mmol/L (ref 20–29)
Calcium: 9.6 mg/dL (ref 8.7–10.3)
Chloride: 105 mmol/L (ref 96–106)
Creatinine, Ser: 1.21 mg/dL — ABNORMAL HIGH (ref 0.57–1.00)
GFR calc Af Amer: 48 mL/min/{1.73_m2} — ABNORMAL LOW (ref >59)
GFR calc non Af Amer: 42 mL/min/{1.73_m2} — ABNORMAL LOW (ref >59)
Glucose: 90 mg/dL (ref 65–99)
Potassium: 4.1 mmol/L (ref 3.5–5.2)
Sodium: 144 mmol/L (ref 134–144)

## 2019-01-29 NOTE — Progress Notes (Signed)
S/w pt advised her of blood work  and sonogram results

## 2019-02-02 ENCOUNTER — Other Ambulatory Visit: Payer: Self-pay

## 2019-02-02 ENCOUNTER — Ambulatory Visit (INDEPENDENT_AMBULATORY_CARE_PROVIDER_SITE_OTHER): Payer: Medicare Other | Admitting: Cardiology

## 2019-02-02 VITALS — BP 155/71 | HR 56 | Temp 98.6°F

## 2019-02-02 DIAGNOSIS — I10 Essential (primary) hypertension: Secondary | ICD-10-CM | POA: Diagnosis not present

## 2019-02-02 NOTE — Progress Notes (Signed)
BP readings much lower on home monitoring (around 140/80 mmHg). BP elevated here today. Improved on subsequent check.  No change made to her mediations today.  Nigel Mormon, MD [facility] Pager: [phone] Office: [phone] If no answer Cell [phone]

## 2019-04-06 ENCOUNTER — Other Ambulatory Visit: Payer: Self-pay

## 2019-04-06 DIAGNOSIS — I10 Essential (primary) hypertension: Secondary | ICD-10-CM

## 2019-04-06 MED ORDER — SPIRONOLACTONE 25 MG PO TABS: 25.0000 mg | ORAL_TABLET | Freq: Every day | ORAL | 3 refills | Status: DC

## 2019-06-06 ENCOUNTER — Ambulatory Visit: Payer: Medicare Other | Admitting: Cardiology

## 2019-06-06 ENCOUNTER — Other Ambulatory Visit: Payer: Self-pay

## 2019-06-06 ENCOUNTER — Encounter: Payer: Self-pay | Admitting: Cardiology

## 2019-06-06 VITALS — BP 136/60 | HR 66 | Ht 65.0 in | Wt 165.8 lb

## 2019-06-06 DIAGNOSIS — I4821 Permanent atrial fibrillation: Secondary | ICD-10-CM

## 2019-06-06 DIAGNOSIS — M7989 Other specified soft tissue disorders: Secondary | ICD-10-CM | POA: Diagnosis not present

## 2019-06-06 DIAGNOSIS — I10 Essential (primary) hypertension: Secondary | ICD-10-CM | POA: Diagnosis not present

## 2019-06-06 NOTE — Progress Notes (Signed)
Follow up visit  Subjective:   Miranda Parsons, female    DOB: Aug 20, 1934, 84 y.o.   MRN: [id number]   Chief Complaint  Patient presents with  . Leg Swelling    HPI  84 y/o [place] American female with h/o rheumatic mitral valve disease, s/p mechanical valve replacement (1989), hypertension, venous insufficiency.  Patient is here today for a visit, concerned about her leg swelling. She denies any shortness of breath, chest pain.  Past Medical History:  Diagnosis Date  . Chronic atrial fibrillation 03/28/2018  . Edema 03/28/2018  . Hyperlipidemia 03/28/2018  . Hypertensive heart disease without heart failure 03/28/2018  . Presence of prosthetic heart valve 03/28/2018  . TIA (transient ischemic attack) 03/28/2018  . Venous insufficiency 03/28/2018     Past Surgical History:  Procedure Laterality Date  . CARDIAC CATHETERIZATION    . DILATION AND CURETTAGE OF UTERUS    . MITRAL VALVE REPLACEMENT       Social History   Socioeconomic History  . Marital status: Married    Spouse name: Not on file  . Number of children: 1  . Years of education: Not on file  . Highest education level: Not on file  Occupational History  . Not on file  Tobacco Use  . Smoking status: Former Smoker    Packs/day: 0.25    Years: 15.00    Pack years: 3.75    Types: Cigarettes    Quit date: 1980    Years since quitting: 41.0  . Smokeless tobacco: Never Used  Substance and Sexual Activity  . Alcohol use: Not Currently  . Drug use: Never  . Sexual activity: Not on file  Other Topics Concern  . Not on file  Social History Narrative  . Not on file   Social Determinants of Health   Financial Resource Strain:   . Difficulty of Paying Living Expenses: Not on file  Food Insecurity:   . Worried About Charity fundraiser in the Last Year: Not on file  . Ran Out of Food in the Last Year: Not on file  Transportation Needs:   . Lack of Transportation (Medical): Not on file  . Lack of  Transportation (Non-Medical): Not on file  Physical Activity:   . Days of Exercise per Week: Not on file  . Minutes of Exercise per Session: Not on file  Stress:   . Feeling of Stress : Not on file  Social Connections:   . Frequency of Communication with Friends and Family: Not on file  . Frequency of Social Gatherings with Friends and Family: Not on file  . Attends Religious Services: Not on file  . Active Member of Clubs or Organizations: Not on file  . Attends Archivist Meetings: Not on file  . Marital Status: Not on file  Intimate Partner Violence:   . Fear of Current or Ex-Partner: Not on file  . Emotionally Abused: Not on file  . Physically Abused: Not on file  . Sexually Abused: Not on file     Family History  Problem Relation Age of Onset  . Heart Problems Mother   . Coronary artery disease Father   . Diabetes Brother   . Hypertension Sister      Current Outpatient Medications on File Prior to Visit  Medication Sig Dispense Refill  . alendronate (FOSAMAX) 70 MG tablet once a week.    [name] atenolol (TENORMIN) 25 MG tablet Take 1 tablet by mouth daily.    [name]  atorvastatin (LIPITOR) 80 MG tablet Take 80 mg by mouth daily.    . Calcium 500-125 MG-UNIT TABS Take 1 tablet by mouth once a week.     . docusate sodium (COLACE) 100 MG capsule Take 100 mg by mouth daily.    . ergocalciferol (VITAMIN D2) 50000 units capsule Take 50,000 Units by mouth once a week.    . furosemide (LASIX) 40 MG tablet Take 40 mg by mouth daily.     . Horse Chestnut 300 MG CAPS Take 1 capsule by mouth daily.    [name] losartan (COZAAR) 100 MG tablet Take 1 tablet (100 mg total) by mouth daily. 90 tablet 3  . spironolactone (ALDACTONE) 25 MG tablet Take 1 tablet (25 mg total) by mouth daily. 90 tablet 3  . vitamin B-12 (CYANOCOBALAMIN) 500 MCG tablet Take 500 mcg by mouth once a week.    . warfarin (COUMADIN) 3 MG tablet Take 3 mg by mouth daily.      No current facility-administered medications  on file prior to visit.    Cardiovascular studies:  EKG 06/06/2019: Atrial fibrillation 73 bpm.  Left ventricular hypertrophy. Possible  Anteroseptal  ischemia.   Lower Extremity Venous Duplex  01/22/2019: No evidence of deep vein thrombosis of the bilateral lower extremities with normal venous return.  Echocardiogram 01/19/2019: Left ventricle cavity is normal in size. Abnormal septal wall motion due to post-operative valve. Normal LV systolic function with EF 59%. Diastolic function not assessed due to atrial fibrillation and post-op status.  Calculated EF 59%. Left atrial cavity is severely dilated. Right atrial cavity is severely dilated. Mild (Grade I) aortic regurgitation. Well seated mechanical mitral valve with normal excursion.  Mean PG 3 mmHg, which is normal.  No mitral valve regurgitation noted. Dilation of the tricuspid valve annulus. Moderate tricuspid regurgitation. Estimated pulmonary artery systolic pressure is 44 mmHg. Mild pulmonic regurgitation. Small, posterior pericardial effusion with no hemodynamic compromise. Compared to previous study  in 203, small pericardial effusion is new. No other changes noted.   EKG 12/28/2018: Atrial fibrillation with controlled ventricular rate. Nonspecific ST-T changes.   Recent labs: 01/26/2019: Glucose 90, BUN/Cr 10/1.21. EGFR 48. Na/K 144/4.1.     Review of Systems  Constitution: Negative for decreased appetite, malaise/fatigue, weight gain and weight loss.  HENT: Negative for congestion.   Eyes: Negative for visual disturbance.  Cardiovascular: Positive for leg swelling. Negative for chest pain, dyspnea on exertion, palpitations and syncope.  Respiratory: Negative for cough.   Endocrine: Negative for cold intolerance.  Hematologic/Lymphatic: Does not bruise/bleed easily.  Skin: Negative for itching and rash.  Musculoskeletal: Negative for myalgias.  Gastrointestinal: Negative for abdominal pain, nausea and vomiting.   Genitourinary: Negative for dysuria.  Neurological: Negative for dizziness and weakness.  Psychiatric/Behavioral: The patient is not nervous/anxious.   All other systems reviewed and are negative.        Vitals:   06/06/19 1135  BP: 136/60  Pulse: 66  SpO2: 99%     Body mass index is 27.59 kg/m. Filed Weights   06/06/19 1135  Weight: 165 lb 12.8 oz (75.2 kg)     Objective:   Physical Exam  Constitutional: She is oriented to person, place, and time. She appears well-developed and well-nourished. No distress.  HENT:  Head: Normocephalic and atraumatic.  Eyes: Pupils are equal, round, and reactive to light. Conjunctivae are normal.  Neck: No JVD present.  Cardiovascular: Normal rate and intact distal pulses. An irregularly irregular rhythm present.  No murmur heard.  Metallic S1  Pulmonary/Chest: Effort normal and breath sounds normal. She has no wheezes. She has no rales.  Abdominal: Soft. Bowel sounds are normal. There is no rebound.  Musculoskeletal:        General: No edema.     Comments: Rt Below knee 40.5 cm Mid calf 33 cm Lower calf 26 cm  Lt Below knee 40.5 cm Mid calf 33.5 cm Lower calf 26 cm   Lymphadenopathy:    She has no cervical adenopathy.  Neurological: She is alert and oriented to person, place, and time. No cranial nerve deficit.  Skin: Skin is warm and dry.  Psychiatric: She has a normal mood and affect.  Nursing note and vitals reviewed.         Assessment & Recommendations:   84 y/o [place] female with h/o rheumatic mitral valve disease, s/p mechanical valve replacement (1989), hypertension, venous insufficiency.  Hypertension: Controlled. Tolerating spironolactone and losartan fairly well without any adverse effects on renal function. I do not see any significant pitting edema. No difference in measurements of both legs.   Mild PH, small pericardial effusion: Clinically asymptomatic. S/p mechanical MVR with normal  function.   Permanent valvular atrial fibrillation: Rate controlled. Continue warfarin, INR managed by PCP.    Repeat echocardiogram and f/u in 8 months.   Nigel Mormon, MD [facility] Pager: [phone] Office: [phone] If no answer Cell [phone]

## 2019-06-07 ENCOUNTER — Encounter

## 2019-07-06 ENCOUNTER — Other Ambulatory Visit: Payer: Self-pay

## 2019-07-06 DIAGNOSIS — I10 Essential (primary) hypertension: Secondary | ICD-10-CM

## 2019-07-06 MED ORDER — SPIRONOLACTONE 25 MG PO TABS: 25.0000 mg | ORAL_TABLET | Freq: Every day | ORAL | 3 refills | Status: DC

## 2019-07-26 ENCOUNTER — Ambulatory Visit: Payer: Medicare Other | Admitting: Cardiology

## 2019-08-29 DIAGNOSIS — I4821 Permanent atrial fibrillation: Secondary | ICD-10-CM | POA: Diagnosis not present

## 2019-08-29 DIAGNOSIS — Z952 Presence of prosthetic heart valve: Secondary | ICD-10-CM | POA: Diagnosis not present

## 2019-08-29 DIAGNOSIS — Z7901 Long term (current) use of anticoagulants: Secondary | ICD-10-CM | POA: Diagnosis not present

## 2019-10-03 DIAGNOSIS — Z7901 Long term (current) use of anticoagulants: Secondary | ICD-10-CM | POA: Diagnosis not present

## 2019-10-03 DIAGNOSIS — Z952 Presence of prosthetic heart valve: Secondary | ICD-10-CM | POA: Diagnosis not present

## 2019-10-30 DIAGNOSIS — Z7901 Long term (current) use of anticoagulants: Secondary | ICD-10-CM | POA: Diagnosis not present

## 2019-10-30 DIAGNOSIS — I4821 Permanent atrial fibrillation: Secondary | ICD-10-CM | POA: Diagnosis not present

## 2019-10-30 DIAGNOSIS — Z952 Presence of prosthetic heart valve: Secondary | ICD-10-CM | POA: Diagnosis not present

## 2019-11-28 DIAGNOSIS — Z7901 Long term (current) use of anticoagulants: Secondary | ICD-10-CM | POA: Diagnosis not present

## 2019-11-28 DIAGNOSIS — Z952 Presence of prosthetic heart valve: Secondary | ICD-10-CM | POA: Diagnosis not present

## 2019-11-28 DIAGNOSIS — I4821 Permanent atrial fibrillation: Secondary | ICD-10-CM | POA: Diagnosis not present

## 2019-12-20 DIAGNOSIS — Z0000 Encounter for general adult medical examination without abnormal findings: Secondary | ICD-10-CM | POA: Diagnosis not present

## 2019-12-20 DIAGNOSIS — M810 Age-related osteoporosis without current pathological fracture: Secondary | ICD-10-CM | POA: Diagnosis not present

## 2019-12-20 DIAGNOSIS — E7849 Other hyperlipidemia: Secondary | ICD-10-CM | POA: Diagnosis not present

## 2019-12-20 DIAGNOSIS — R7302 Impaired glucose tolerance (oral): Secondary | ICD-10-CM | POA: Diagnosis not present

## 2019-12-20 DIAGNOSIS — I10 Essential (primary) hypertension: Secondary | ICD-10-CM | POA: Diagnosis not present

## 2019-12-27 DIAGNOSIS — I10 Essential (primary) hypertension: Secondary | ICD-10-CM | POA: Diagnosis not present

## 2019-12-27 DIAGNOSIS — Z952 Presence of prosthetic heart valve: Secondary | ICD-10-CM | POA: Diagnosis not present

## 2019-12-27 DIAGNOSIS — R7302 Impaired glucose tolerance (oral): Secondary | ICD-10-CM | POA: Diagnosis not present

## 2019-12-27 DIAGNOSIS — R82998 Other abnormal findings in urine: Secondary | ICD-10-CM | POA: Diagnosis not present

## 2019-12-27 DIAGNOSIS — I4821 Permanent atrial fibrillation: Secondary | ICD-10-CM | POA: Diagnosis not present

## 2019-12-27 DIAGNOSIS — E785 Hyperlipidemia, unspecified: Secondary | ICD-10-CM | POA: Diagnosis not present

## 2019-12-27 DIAGNOSIS — I071 Rheumatic tricuspid insufficiency: Secondary | ICD-10-CM | POA: Diagnosis not present

## 2019-12-27 DIAGNOSIS — Z1331 Encounter for screening for depression: Secondary | ICD-10-CM | POA: Diagnosis not present

## 2019-12-27 DIAGNOSIS — Z0000 Encounter for general adult medical examination without abnormal findings: Secondary | ICD-10-CM | POA: Diagnosis not present

## 2019-12-27 DIAGNOSIS — M542 Cervicalgia: Secondary | ICD-10-CM | POA: Diagnosis not present

## 2019-12-27 DIAGNOSIS — I272 Pulmonary hypertension, unspecified: Secondary | ICD-10-CM | POA: Diagnosis not present

## 2019-12-28 ENCOUNTER — Other Ambulatory Visit: Payer: Self-pay | Admitting: Cardiology

## 2019-12-28 DIAGNOSIS — I10 Essential (primary) hypertension: Secondary | ICD-10-CM

## 2020-01-11 ENCOUNTER — Other Ambulatory Visit: Payer: Self-pay

## 2020-01-11 DIAGNOSIS — M7989 Other specified soft tissue disorders: Secondary | ICD-10-CM

## 2020-01-14 ENCOUNTER — Ambulatory Visit: Payer: Medicare Other

## 2020-01-14 ENCOUNTER — Other Ambulatory Visit: Payer: Self-pay

## 2020-01-14 DIAGNOSIS — M7989 Other specified soft tissue disorders: Secondary | ICD-10-CM | POA: Diagnosis not present

## 2020-01-14 DIAGNOSIS — I10 Essential (primary) hypertension: Secondary | ICD-10-CM | POA: Diagnosis not present

## 2020-01-21 ENCOUNTER — Other Ambulatory Visit: Payer: Medicare Other

## 2020-01-28 ENCOUNTER — Ambulatory Visit: Payer: Medicare Other | Admitting: Cardiology

## 2020-01-30 ENCOUNTER — Ambulatory Visit: Payer: Medicare Other | Admitting: Cardiology

## 2020-02-06 DIAGNOSIS — I351 Nonrheumatic aortic (valve) insufficiency: Secondary | ICD-10-CM | POA: Diagnosis not present

## 2020-02-06 DIAGNOSIS — Z7901 Long term (current) use of anticoagulants: Secondary | ICD-10-CM | POA: Diagnosis not present

## 2020-02-06 DIAGNOSIS — I4821 Permanent atrial fibrillation: Secondary | ICD-10-CM | POA: Diagnosis not present

## 2020-02-07 ENCOUNTER — Ambulatory Visit: Payer: Medicare Other | Admitting: Cardiology

## 2020-02-07 ENCOUNTER — Encounter: Payer: Self-pay | Admitting: Cardiology

## 2020-02-07 ENCOUNTER — Other Ambulatory Visit: Payer: Self-pay

## 2020-02-07 VITALS — BP 161/64 | HR 65 | Resp 16 | Ht 65.0 in | Wt 162.0 lb

## 2020-02-07 DIAGNOSIS — I4821 Permanent atrial fibrillation: Secondary | ICD-10-CM | POA: Diagnosis not present

## 2020-02-07 DIAGNOSIS — I10 Essential (primary) hypertension: Secondary | ICD-10-CM | POA: Diagnosis not present

## 2020-02-07 DIAGNOSIS — Z952 Presence of prosthetic heart valve: Secondary | ICD-10-CM | POA: Diagnosis not present

## 2020-02-07 DIAGNOSIS — M79604 Pain in right leg: Secondary | ICD-10-CM | POA: Insufficient documentation

## 2020-02-07 DIAGNOSIS — M79605 Pain in left leg: Secondary | ICD-10-CM | POA: Diagnosis not present

## 2020-02-07 NOTE — Progress Notes (Signed)
Follow up visit  Subjective:   Miranda Parsons, female    DOB: 01-27-1935, 85 y.o.   MRN: [id number]   Chief Complaint  Patient presents with  . Hypertension  . Leg Swelling  . Follow-up    HPI  85 y/o [place] American female with h/o rheumatic mitral valve disease, s/p mechanical valve replacement (1989), hypertension, venous insufficiency.  Patient is here today for a follow up visit. She denies chest pain, palpitations, shortness of breath, syncope, or symptoms suggestive of TIA. Reports chronic bilateral leg swelling, which improves with use of supportive stockings. She has mild bilateral leg pain when she walks.   Patient regularly checks blood pressure at home. Reports systolic readings 644-034 at home. It is elevated in office today.    Current Outpatient Medications on File Prior to Visit  Medication Sig Dispense Refill  . alendronate (FOSAMAX) 70 MG tablet once a week.    [name] atenolol (TENORMIN) 25 MG tablet Take 1 tablet by mouth daily.    [name] atorvastatin (LIPITOR) 80 MG tablet Take 80 mg by mouth daily.    . Calcium 500-125 MG-UNIT TABS Take 1 tablet by mouth once a week.     . docusate sodium (COLACE) 100 MG capsule Take 100 mg by mouth daily.    . ergocalciferol (VITAMIN D2) 50000 units capsule Take 50,000 Units by mouth once a week.    . furosemide (LASIX) 40 MG tablet Take 40 mg by mouth daily.     . Horse Chestnut 300 MG CAPS Take 1 capsule by mouth daily.    [name] losartan (COZAAR) 100 MG tablet TAKE 1 TABLET BY MOUTH  DAILY 90 tablet 3  . spironolactone (ALDACTONE) 25 MG tablet Take 1 tablet (25 mg total) by mouth daily. 90 tablet 3  . vitamin B-12 (CYANOCOBALAMIN) 500 MCG tablet Take 500 mcg by mouth once a week.    . warfarin (COUMADIN) 3 MG tablet Take 3 mg by mouth daily.      No current facility-administered medications on file prior to visit.    Cardiovascular studies:  Echocardiogram 01/14/2020:  Left ventricle cavity is normal in size and wall  thickness. Abnormal  septal wall motion due to post-operative valve. Normal LV systolic  function with EF 55%. Diastolic function not assessed due to post-op valve  status. Left atrium bulges towards right atrium, suggesting elevated LAP.   Left atrial cavity is severely dilated.  Right atrial cavity is moderately dilated.  Right ventricle cavity is mildly dilated. Normal right ventricular  function.  Trileaflet aortic valve. Mild to moderate aortic regurgitation.  Well seated mechanical mitral valve with normal excursion. Mean PG 4 mmHg  at 95 bpm. Trace mitral valve regurgitation.  Moderate tricuspid regurgitation. Moderate pulmonary hypertension.  Estimated pulmonary artery systolic pressure 44 mmHg.  Moderate pulmonic regurgitation.  Unlike previous study in 2020, pericardial effusion not seen on this  study.   EKG 06/06/2019: Atrial fibrillation 73 bpm.  Left ventricular hypertrophy. Possible  Anteroseptal  ischemia.   Lower Extremity Venous Duplex  01/22/2019: No evidence of deep vein thrombosis of the bilateral lower extremities with normal venous return.  Recent labs: 12/20/2019: Glucose 103, BUN/Cr 14/1.5. EGFR 33.  HbA1C 5.0% Chol 161, TG 106, HDL 64, LDL 76 TSH 3.1 normal  01/26/2019: Glucose 90, BUN/Cr 10/1.21. EGFR 48. Na/K 144/4.1.    Review of Systems  Constitutional: Negative for decreased appetite, malaise/fatigue, weight gain and weight loss.  HENT: Negative for congestion.   Eyes: Negative for  visual disturbance.  Cardiovascular: Positive for leg swelling. Negative for chest pain, dyspnea on exertion, palpitations and syncope.  Respiratory: Negative for cough.   Endocrine: Negative for cold intolerance.  Hematologic/Lymphatic: Does not bruise/bleed easily.  Skin: Negative for itching and rash.  Musculoskeletal: Negative for myalgias.  Gastrointestinal: Negative for abdominal pain, nausea and vomiting.  Genitourinary: Negative for dysuria.    Neurological: Negative for dizziness and weakness.  Psychiatric/Behavioral: The patient is not nervous/anxious.   All other systems reviewed and are negative.       Vitals:   02/07/20 0956  BP: (!) 161/64  Pulse: 65  Resp: 16  SpO2: 100%    Body mass index is 26.96 kg/m. Filed Weights   02/07/20 0956  Weight: 162 lb (73.5 kg)     Objective:   Physical Exam Vitals and nursing note reviewed.  Constitutional:      General: She is not in acute distress.    Appearance: She is well-developed.  HENT:     Head: Normocephalic and atraumatic.  Neck:     Vascular: No JVD.  Cardiovascular:     Rate and Rhythm: Normal rate. Rhythm irregularly irregular.     Pulses: Intact distal pulses.          Radial pulses are 2+ on the right side and 2+ on the left side.       Dorsalis pedis pulses are 1+ on the right side and 1+ on the left side.       Posterior tibial pulses are 1+ on the right side and 1+ on the left side.     Heart sounds: No murmur heard.      Comments: Metallic S1 Pulmonary:     Effort: Pulmonary effort is normal.     Breath sounds: Normal breath sounds. No wheezing or rales.  Skin:    General: Skin is warm and dry.  Neurological:     Mental Status: She is alert and oriented to person, place, and time.     Cranial Nerves: No cranial nerve deficit.           Assessment & Recommendations:   85 y/o [place] American female with h/o rheumatic mitral valve disease, s/p mechanical valve replacement (1989), hypertension, venous insufficiency.  Hypertension: Elevated in office today. Patient reports BP is controlled at home. Encouraged her to continue to monitor regularly and to follow up with me or PCP if systolic blood pressure remains in the 140s consistently at home. No significant pitting edema.  Reviewed labs. GFR is trending down. Recommend patient follow up with PCP. Could consider referral to nephrology for evaluation.   Pain in both lower extremities:   In light of claudication symptoms, will evaluate with ABI.   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   Permanent valvular atrial fibrillation: Rate controlled. Continue warfarin, INR managed by PCP.    Repeat echocardiogram and f/u in 1 year  Nigel Mormon, MD [facility] Pager: [phone] Office: [phone] If no answer Cell [phone]

## 2020-02-13 DIAGNOSIS — H35363 Drusen (degenerative) of macula, bilateral: Secondary | ICD-10-CM | POA: Diagnosis not present

## 2020-02-13 DIAGNOSIS — H35033 Hypertensive retinopathy, bilateral: Secondary | ICD-10-CM | POA: Diagnosis not present

## 2020-02-13 DIAGNOSIS — H524 Presbyopia: Secondary | ICD-10-CM | POA: Diagnosis not present

## 2020-02-13 DIAGNOSIS — H40013 Open angle with borderline findings, low risk, bilateral: Secondary | ICD-10-CM | POA: Diagnosis not present

## 2020-02-13 DIAGNOSIS — H35433 Paving stone degeneration of retina, bilateral: Secondary | ICD-10-CM | POA: Diagnosis not present

## 2020-02-20 DIAGNOSIS — Z1231 Encounter for screening mammogram for malignant neoplasm of breast: Secondary | ICD-10-CM | POA: Diagnosis not present

## 2020-02-28 ENCOUNTER — Other Ambulatory Visit: Payer: Self-pay

## 2020-02-28 ENCOUNTER — Ambulatory Visit: Payer: Medicare PPO

## 2020-02-28 DIAGNOSIS — M79604 Pain in right leg: Secondary | ICD-10-CM | POA: Diagnosis not present

## 2020-02-28 DIAGNOSIS — M79605 Pain in left leg: Secondary | ICD-10-CM | POA: Diagnosis not present

## 2020-03-03 NOTE — Progress Notes (Signed)
Patient didn't answer will try again later sch/rma

## 2020-03-03 NOTE — Progress Notes (Signed)
2nd attempt, spoke to patient and she is aware

## 2020-03-03 NOTE — Progress Notes (Signed)
Called patient, Miranda Parsons, LMAM

## 2020-03-26 DIAGNOSIS — Z7901 Long term (current) use of anticoagulants: Secondary | ICD-10-CM | POA: Diagnosis not present

## 2020-03-26 DIAGNOSIS — I351 Nonrheumatic aortic (valve) insufficiency: Secondary | ICD-10-CM | POA: Diagnosis not present

## 2020-03-26 DIAGNOSIS — I4821 Permanent atrial fibrillation: Secondary | ICD-10-CM | POA: Diagnosis not present

## 2020-05-07 DIAGNOSIS — I4821 Permanent atrial fibrillation: Secondary | ICD-10-CM | POA: Diagnosis not present

## 2020-05-07 DIAGNOSIS — Z7901 Long term (current) use of anticoagulants: Secondary | ICD-10-CM | POA: Diagnosis not present

## 2020-05-07 DIAGNOSIS — I351 Nonrheumatic aortic (valve) insufficiency: Secondary | ICD-10-CM | POA: Diagnosis not present

## 2020-05-16 ENCOUNTER — Other Ambulatory Visit: Payer: Self-pay | Admitting: Cardiology

## 2020-05-16 DIAGNOSIS — I10 Essential (primary) hypertension: Secondary | ICD-10-CM

## 2020-06-18 DIAGNOSIS — I482 Chronic atrial fibrillation, unspecified: Secondary | ICD-10-CM | POA: Diagnosis not present

## 2020-06-18 DIAGNOSIS — Z7901 Long term (current) use of anticoagulants: Secondary | ICD-10-CM | POA: Diagnosis not present

## 2020-06-18 DIAGNOSIS — I351 Nonrheumatic aortic (valve) insufficiency: Secondary | ICD-10-CM | POA: Diagnosis not present

## 2020-08-06 DIAGNOSIS — I351 Nonrheumatic aortic (valve) insufficiency: Secondary | ICD-10-CM | POA: Diagnosis not present

## 2020-08-06 DIAGNOSIS — I4821 Permanent atrial fibrillation: Secondary | ICD-10-CM | POA: Diagnosis not present

## 2020-08-06 DIAGNOSIS — Z7901 Long term (current) use of anticoagulants: Secondary | ICD-10-CM | POA: Diagnosis not present

## 2020-09-17 DIAGNOSIS — Z7901 Long term (current) use of anticoagulants: Secondary | ICD-10-CM | POA: Diagnosis not present

## 2020-09-17 DIAGNOSIS — Z952 Presence of prosthetic heart valve: Secondary | ICD-10-CM | POA: Diagnosis not present

## 2020-09-17 DIAGNOSIS — I482 Chronic atrial fibrillation, unspecified: Secondary | ICD-10-CM | POA: Diagnosis not present

## 2020-10-08 DIAGNOSIS — I482 Chronic atrial fibrillation, unspecified: Secondary | ICD-10-CM | POA: Diagnosis not present

## 2020-10-08 DIAGNOSIS — Z7901 Long term (current) use of anticoagulants: Secondary | ICD-10-CM | POA: Diagnosis not present

## 2020-10-08 DIAGNOSIS — Z952 Presence of prosthetic heart valve: Secondary | ICD-10-CM | POA: Diagnosis not present

## 2020-10-28 ENCOUNTER — Other Ambulatory Visit: Payer: Self-pay | Admitting: Cardiology

## 2020-10-28 DIAGNOSIS — I10 Essential (primary) hypertension: Secondary | ICD-10-CM

## 2020-11-05 DIAGNOSIS — Z7901 Long term (current) use of anticoagulants: Secondary | ICD-10-CM | POA: Diagnosis not present

## 2020-11-05 DIAGNOSIS — I351 Nonrheumatic aortic (valve) insufficiency: Secondary | ICD-10-CM | POA: Diagnosis not present

## 2020-11-05 DIAGNOSIS — I4821 Permanent atrial fibrillation: Secondary | ICD-10-CM | POA: Diagnosis not present

## 2020-12-02 DIAGNOSIS — I4821 Permanent atrial fibrillation: Secondary | ICD-10-CM | POA: Diagnosis not present

## 2020-12-02 DIAGNOSIS — I071 Rheumatic tricuspid insufficiency: Secondary | ICD-10-CM | POA: Diagnosis not present

## 2020-12-02 DIAGNOSIS — I351 Nonrheumatic aortic (valve) insufficiency: Secondary | ICD-10-CM | POA: Diagnosis not present

## 2020-12-02 DIAGNOSIS — Z7901 Long term (current) use of anticoagulants: Secondary | ICD-10-CM | POA: Diagnosis not present

## 2020-12-25 DIAGNOSIS — I10 Essential (primary) hypertension: Secondary | ICD-10-CM | POA: Diagnosis not present

## 2020-12-25 DIAGNOSIS — E785 Hyperlipidemia, unspecified: Secondary | ICD-10-CM | POA: Diagnosis not present

## 2020-12-25 DIAGNOSIS — R7302 Impaired glucose tolerance (oral): Secondary | ICD-10-CM | POA: Diagnosis not present

## 2020-12-25 DIAGNOSIS — E559 Vitamin D deficiency, unspecified: Secondary | ICD-10-CM | POA: Diagnosis not present

## 2021-01-02 DIAGNOSIS — E785 Hyperlipidemia, unspecified: Secondary | ICD-10-CM | POA: Diagnosis not present

## 2021-01-02 DIAGNOSIS — I129 Hypertensive chronic kidney disease with stage 1 through stage 4 chronic kidney disease, or unspecified chronic kidney disease: Secondary | ICD-10-CM | POA: Diagnosis not present

## 2021-01-02 DIAGNOSIS — Z1331 Encounter for screening for depression: Secondary | ICD-10-CM | POA: Diagnosis not present

## 2021-01-02 DIAGNOSIS — D7589 Other specified diseases of blood and blood-forming organs: Secondary | ICD-10-CM | POA: Diagnosis not present

## 2021-01-02 DIAGNOSIS — I872 Venous insufficiency (chronic) (peripheral): Secondary | ICD-10-CM | POA: Diagnosis not present

## 2021-01-02 DIAGNOSIS — I4821 Permanent atrial fibrillation: Secondary | ICD-10-CM | POA: Diagnosis not present

## 2021-01-02 DIAGNOSIS — Z1339 Encounter for screening examination for other mental health and behavioral disorders: Secondary | ICD-10-CM | POA: Diagnosis not present

## 2021-01-02 DIAGNOSIS — N1832 Chronic kidney disease, stage 3b: Secondary | ICD-10-CM | POA: Diagnosis not present

## 2021-01-02 DIAGNOSIS — E559 Vitamin D deficiency, unspecified: Secondary | ICD-10-CM | POA: Diagnosis not present

## 2021-01-02 DIAGNOSIS — I272 Pulmonary hypertension, unspecified: Secondary | ICD-10-CM | POA: Diagnosis not present

## 2021-01-02 DIAGNOSIS — Z0000 Encounter for general adult medical examination without abnormal findings: Secondary | ICD-10-CM | POA: Diagnosis not present

## 2021-01-02 DIAGNOSIS — R82998 Other abnormal findings in urine: Secondary | ICD-10-CM | POA: Diagnosis not present

## 2021-02-09 ENCOUNTER — Ambulatory Visit: Payer: Medicare PPO | Admitting: Cardiology

## 2021-02-09 ENCOUNTER — Encounter: Payer: Self-pay | Admitting: Cardiology

## 2021-02-09 ENCOUNTER — Other Ambulatory Visit: Payer: Self-pay

## 2021-02-09 VITALS — BP 160/71 | HR 60 | Temp 98.6°F | Resp 16 | Ht 65.0 in | Wt 146.0 lb

## 2021-02-09 DIAGNOSIS — Z952 Presence of prosthetic heart valve: Secondary | ICD-10-CM | POA: Diagnosis not present

## 2021-02-09 DIAGNOSIS — I4821 Permanent atrial fibrillation: Secondary | ICD-10-CM

## 2021-02-09 DIAGNOSIS — I10 Essential (primary) hypertension: Secondary | ICD-10-CM

## 2021-02-09 NOTE — Progress Notes (Signed)
Follow up visit  Subjective:   Miranda Parsons, female    DOB: 11/04/1934, 86 y.o.   MRN: [id number]   Chief Complaint  Patient presents with   Hypertension   Atrial Fibrillation   Follow-up    1 year    HPI  86 y/o African American female with h/o rheumatic mitral valve disease, s/p mechanical valve replacement (1989), hypertension, venous insufficiency.   Patient is here today for a follow up visit. She denies chest pain, palpitations, shortness of breath, syncope, or symptoms suggestive of TIA. Has improved.  She walks to the mailbox and back without any significant chest pain or shortness of breath.  Patient has never been on aspirin all along.  Patient regularly checks blood pressure at home. Reports systolic readings around 557D.  Current Outpatient Medications on File Prior to Visit  Medication Sig Dispense Refill   alendronate (FOSAMAX) 70 MG tablet once a week.     atenolol (TENORMIN) 25 MG tablet Take 1 tablet by mouth daily.     atorvastatin (LIPITOR) 80 MG tablet Take 80 mg by mouth daily.     Calcium 500-125 MG-UNIT TABS Take 1 tablet by mouth once a week.      docusate sodium (COLACE) 100 MG capsule Take 100 mg by mouth daily.     ergocalciferol (VITAMIN D2) 50000 units capsule Take 50,000 Units by mouth once a week.     furosemide (LASIX) 40 MG tablet Take 40 mg by mouth daily.      Horse Chestnut 300 MG CAPS Take 1 capsule by mouth daily.     losartan (COZAAR) 100 MG tablet TAKE 1 TABLET EVERY DAY 90 tablet 3   spironolactone (ALDACTONE) 25 MG tablet TAKE 1 TABLET EVERY DAY 90 tablet 3   vitamin B-12 (CYANOCOBALAMIN) 500 MCG tablet Take 500 mcg by mouth once a week.     warfarin (COUMADIN) 3 MG tablet Take 3 mg by mouth daily.      No current facility-administered medications on file prior to visit.    Cardiovascular studies:  EKG 02/09/2021: Atrial fibrillation 78 bpm  Old anteroseptal infarct Nonspecific ST abnormality   ABI 02/28/2020:  This exam  reveals normal perfusion of the right lower extremity (ABI 0.97)  and mildly decreased perfusion of the left lower extremity, noted at the  post tibial artery level (ABI 0.93).  There is normal triphasic waveform at the level of bilateral ankles.  Echocardiogram 01/14/2020:  Left ventricle cavity is normal in size and wall thickness. Abnormal  septal wall motion due to post-operative valve. Normal LV systolic  function with EF 55%. Diastolic function not assessed due to post-op valve  status. Left atrium bulges towards right atrium, suggesting elevated LAP.    Left atrial cavity is severely dilated.  Right atrial cavity is moderately dilated.  Right ventricle cavity is mildly dilated. Normal right ventricular  function.  Trileaflet aortic valve.  Mild to moderate aortic regurgitation.  Well seated mechanical mitral valve with normal excursion.  Mean PG 4 mmHg  at 95 bpm. Trace mitral valve regurgitation.  Moderate tricuspid regurgitation. Moderate pulmonary hypertension.  Estimated pulmonary artery systolic pressure 44 mmHg.  Moderate pulmonic regurgitation.  Unlike previous study in 2020, pericardial effusion not seen on this  study.   EKG 06/06/2019: Atrial fibrillation 73 bpm.  Left ventricular hypertrophy. Possible  Anteroseptal  ischemia.   Lower Extremity Venous Duplex  01/22/2019: No evidence of deep vein thrombosis of the bilateral lower extremities with normal venous  return.  Recent labs: 12/20/2019: Glucose 103, BUN/Cr 14/1.5. EGFR 33.  HbA1C 5.0% Chol 161, TG 106, HDL 64, LDL 76 TSH 3.1 normal  01/26/2019: Glucose 90, BUN/Cr 10/1.21. EGFR 48. Na/K 144/4.1.    Review of Systems  Cardiovascular:  Negative for chest pain, dyspnea on exertion, leg swelling, palpitations and syncope.       Vitals:   02/09/21 1147  BP: (!) 160/71  Pulse: 60  Resp: 16  Temp: 98.6 F (37 C)  SpO2: 96%    Body mass index is 24.3 kg/m. Filed Weights   02/09/21 1147  Weight:  146 lb (66.2 kg)     Objective:   Physical Exam Vitals and nursing note reviewed.  Constitutional:      General: She is not in acute distress. Neck:     Vascular: No JVD.  Cardiovascular:     Rate and Rhythm: Normal rate and regular rhythm.     Heart sounds: No murmur heard.    Comments: Metallic S1 Pulmonary:     Effort: Pulmonary effort is normal.     Breath sounds: Normal breath sounds. No wheezing or rales.          Assessment & Recommendations:   86 y/o [place] American female with h/o rheumatic mitral valve disease, s/p mechanical valve replacement (1989), hypertension, venous insufficiency.  Hypertension: Suspect whitecoat hypertension.  No changes made to antihypertensive therapy today.  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  Will check echocardiogram now and repeat in 1 year.  Permanent valvular atrial fibrillation: Rate controlled. Continue warfarin, INR managed by PCP.  Ideally, aspirin and warfarin both recommended after mechanical valve replacement.  However, patient has done well without aspirin for over 20 years.  Given her advanced age and risk of bleeding, reasonable to forego aspirin.  Repeat echocardiogram and f/u in 1 year  Nigel Mormon, MD [facility] [place] Pager: [phone] Office: [phone] If no answer Cell [phone]

## 2021-02-12 DIAGNOSIS — H35371 Puckering of macula, right eye: Secondary | ICD-10-CM | POA: Diagnosis not present

## 2021-02-12 DIAGNOSIS — H35363 Drusen (degenerative) of macula, bilateral: Secondary | ICD-10-CM | POA: Diagnosis not present

## 2021-02-12 DIAGNOSIS — H35033 Hypertensive retinopathy, bilateral: Secondary | ICD-10-CM | POA: Diagnosis not present

## 2021-02-12 DIAGNOSIS — H40013 Open angle with borderline findings, low risk, bilateral: Secondary | ICD-10-CM | POA: Diagnosis not present

## 2021-02-19 ENCOUNTER — Other Ambulatory Visit: Payer: Self-pay

## 2021-02-19 ENCOUNTER — Ambulatory Visit: Payer: Medicare PPO

## 2021-02-19 DIAGNOSIS — I4821 Permanent atrial fibrillation: Secondary | ICD-10-CM

## 2021-02-19 DIAGNOSIS — Z952 Presence of prosthetic heart valve: Secondary | ICD-10-CM | POA: Diagnosis not present

## 2021-02-25 DIAGNOSIS — Z7901 Long term (current) use of anticoagulants: Secondary | ICD-10-CM | POA: Diagnosis not present

## 2021-02-25 DIAGNOSIS — Z952 Presence of prosthetic heart valve: Secondary | ICD-10-CM | POA: Diagnosis not present

## 2021-02-25 DIAGNOSIS — Z1231 Encounter for screening mammogram for malignant neoplasm of breast: Secondary | ICD-10-CM | POA: Diagnosis not present

## 2021-02-25 DIAGNOSIS — I4821 Permanent atrial fibrillation: Secondary | ICD-10-CM | POA: Diagnosis not present

## 2021-04-03 DIAGNOSIS — I129 Hypertensive chronic kidney disease with stage 1 through stage 4 chronic kidney disease, or unspecified chronic kidney disease: Secondary | ICD-10-CM | POA: Diagnosis not present

## 2021-04-03 DIAGNOSIS — E785 Hyperlipidemia, unspecified: Secondary | ICD-10-CM | POA: Diagnosis not present

## 2021-04-03 DIAGNOSIS — I482 Chronic atrial fibrillation, unspecified: Secondary | ICD-10-CM | POA: Diagnosis not present

## 2021-04-03 DIAGNOSIS — N189 Chronic kidney disease, unspecified: Secondary | ICD-10-CM | POA: Diagnosis not present

## 2021-04-03 DIAGNOSIS — I639 Cerebral infarction, unspecified: Secondary | ICD-10-CM | POA: Diagnosis not present

## 2021-04-03 DIAGNOSIS — N1832 Chronic kidney disease, stage 3b: Secondary | ICD-10-CM | POA: Diagnosis not present

## 2021-04-03 DIAGNOSIS — E559 Vitamin D deficiency, unspecified: Secondary | ICD-10-CM | POA: Diagnosis not present

## 2021-04-06 ENCOUNTER — Other Ambulatory Visit: Payer: Self-pay | Admitting: Internal Medicine

## 2021-04-06 DIAGNOSIS — I129 Hypertensive chronic kidney disease with stage 1 through stage 4 chronic kidney disease, or unspecified chronic kidney disease: Secondary | ICD-10-CM

## 2021-04-06 DIAGNOSIS — I482 Chronic atrial fibrillation, unspecified: Secondary | ICD-10-CM

## 2021-04-06 DIAGNOSIS — I639 Cerebral infarction, unspecified: Secondary | ICD-10-CM

## 2021-04-06 DIAGNOSIS — E559 Vitamin D deficiency, unspecified: Secondary | ICD-10-CM

## 2021-04-06 DIAGNOSIS — N1832 Chronic kidney disease, stage 3b: Secondary | ICD-10-CM

## 2021-04-06 DIAGNOSIS — E785 Hyperlipidemia, unspecified: Secondary | ICD-10-CM

## 2021-04-08 DIAGNOSIS — Z952 Presence of prosthetic heart valve: Secondary | ICD-10-CM | POA: Diagnosis not present

## 2021-04-08 DIAGNOSIS — I4821 Permanent atrial fibrillation: Secondary | ICD-10-CM | POA: Diagnosis not present

## 2021-04-08 DIAGNOSIS — Z7901 Long term (current) use of anticoagulants: Secondary | ICD-10-CM | POA: Diagnosis not present

## 2021-04-08 DIAGNOSIS — I351 Nonrheumatic aortic (valve) insufficiency: Secondary | ICD-10-CM | POA: Diagnosis not present

## 2021-04-16 ENCOUNTER — Ambulatory Visit
Admission: RE | Admit: 2021-04-16 | Discharge: 2021-04-16 | Disposition: A | Discharge status: Home / Self Care | Payer: Medicare PPO | Source: Ambulatory Visit | Attending: Internal Medicine | Admitting: Internal Medicine

## 2021-04-16 DIAGNOSIS — N1832 Chronic kidney disease, stage 3b: Secondary | ICD-10-CM

## 2021-04-16 DIAGNOSIS — E559 Vitamin D deficiency, unspecified: Secondary | ICD-10-CM

## 2021-04-16 DIAGNOSIS — N281 Cyst of kidney, acquired: Secondary | ICD-10-CM | POA: Diagnosis not present

## 2021-04-16 DIAGNOSIS — E785 Hyperlipidemia, unspecified: Secondary | ICD-10-CM

## 2021-04-16 DIAGNOSIS — I129 Hypertensive chronic kidney disease with stage 1 through stage 4 chronic kidney disease, or unspecified chronic kidney disease: Secondary | ICD-10-CM

## 2021-04-16 DIAGNOSIS — I482 Chronic atrial fibrillation, unspecified: Secondary | ICD-10-CM

## 2021-04-16 DIAGNOSIS — I639 Cerebral infarction, unspecified: Secondary | ICD-10-CM

## 2021-05-20 DIAGNOSIS — I4821 Permanent atrial fibrillation: Secondary | ICD-10-CM | POA: Diagnosis not present

## 2021-05-20 DIAGNOSIS — Z952 Presence of prosthetic heart valve: Secondary | ICD-10-CM | POA: Diagnosis not present

## 2021-05-20 DIAGNOSIS — I351 Nonrheumatic aortic (valve) insufficiency: Secondary | ICD-10-CM | POA: Diagnosis not present

## 2021-05-20 DIAGNOSIS — Z7901 Long term (current) use of anticoagulants: Secondary | ICD-10-CM | POA: Diagnosis not present

## 2021-07-01 DIAGNOSIS — I4821 Permanent atrial fibrillation: Secondary | ICD-10-CM | POA: Diagnosis not present

## 2021-07-01 DIAGNOSIS — Z7901 Long term (current) use of anticoagulants: Secondary | ICD-10-CM | POA: Diagnosis not present

## 2021-07-01 DIAGNOSIS — Z952 Presence of prosthetic heart valve: Secondary | ICD-10-CM | POA: Diagnosis not present

## 2021-07-01 DIAGNOSIS — I351 Nonrheumatic aortic (valve) insufficiency: Secondary | ICD-10-CM | POA: Diagnosis not present

## 2021-07-09 ENCOUNTER — Other Ambulatory Visit: Payer: Self-pay | Admitting: Internal Medicine

## 2021-07-09 DIAGNOSIS — R634 Abnormal weight loss: Secondary | ICD-10-CM | POA: Diagnosis not present

## 2021-07-09 DIAGNOSIS — I129 Hypertensive chronic kidney disease with stage 1 through stage 4 chronic kidney disease, or unspecified chronic kidney disease: Secondary | ICD-10-CM | POA: Diagnosis not present

## 2021-07-09 DIAGNOSIS — I272 Pulmonary hypertension, unspecified: Secondary | ICD-10-CM | POA: Diagnosis not present

## 2021-07-09 DIAGNOSIS — Z952 Presence of prosthetic heart valve: Secondary | ICD-10-CM | POA: Diagnosis not present

## 2021-07-09 DIAGNOSIS — N1832 Chronic kidney disease, stage 3b: Secondary | ICD-10-CM | POA: Diagnosis not present

## 2021-07-09 DIAGNOSIS — M810 Age-related osteoporosis without current pathological fracture: Secondary | ICD-10-CM | POA: Diagnosis not present

## 2021-07-09 DIAGNOSIS — I4821 Permanent atrial fibrillation: Secondary | ICD-10-CM | POA: Diagnosis not present

## 2021-07-09 DIAGNOSIS — Z7901 Long term (current) use of anticoagulants: Secondary | ICD-10-CM | POA: Diagnosis not present

## 2021-07-30 ENCOUNTER — Ambulatory Visit
Admission: RE | Admit: 2021-07-30 | Discharge: 2021-07-30 | Disposition: A | Discharge status: Home / Self Care | Payer: Medicare PPO | Source: Ambulatory Visit | Attending: Internal Medicine | Admitting: Internal Medicine

## 2021-07-30 DIAGNOSIS — R634 Abnormal weight loss: Secondary | ICD-10-CM

## 2021-07-30 DIAGNOSIS — K449 Diaphragmatic hernia without obstruction or gangrene: Secondary | ICD-10-CM | POA: Diagnosis not present

## 2021-08-19 DIAGNOSIS — I4821 Permanent atrial fibrillation: Secondary | ICD-10-CM | POA: Diagnosis not present

## 2021-08-19 DIAGNOSIS — Z952 Presence of prosthetic heart valve: Secondary | ICD-10-CM | POA: Diagnosis not present

## 2021-08-19 DIAGNOSIS — Z7901 Long term (current) use of anticoagulants: Secondary | ICD-10-CM | POA: Diagnosis not present

## 2021-09-07 DIAGNOSIS — N1832 Chronic kidney disease, stage 3b: Secondary | ICD-10-CM | POA: Diagnosis not present

## 2021-09-16 DIAGNOSIS — I129 Hypertensive chronic kidney disease with stage 1 through stage 4 chronic kidney disease, or unspecified chronic kidney disease: Secondary | ICD-10-CM | POA: Diagnosis not present

## 2021-09-16 DIAGNOSIS — Z952 Presence of prosthetic heart valve: Secondary | ICD-10-CM | POA: Diagnosis not present

## 2021-09-16 DIAGNOSIS — I482 Chronic atrial fibrillation, unspecified: Secondary | ICD-10-CM | POA: Diagnosis not present

## 2021-09-16 DIAGNOSIS — I4821 Permanent atrial fibrillation: Secondary | ICD-10-CM | POA: Diagnosis not present

## 2021-09-16 DIAGNOSIS — Z7901 Long term (current) use of anticoagulants: Secondary | ICD-10-CM | POA: Diagnosis not present

## 2021-09-16 DIAGNOSIS — I351 Nonrheumatic aortic (valve) insufficiency: Secondary | ICD-10-CM | POA: Diagnosis not present

## 2021-09-16 DIAGNOSIS — E785 Hyperlipidemia, unspecified: Secondary | ICD-10-CM | POA: Diagnosis not present

## 2021-09-16 DIAGNOSIS — N1832 Chronic kidney disease, stage 3b: Secondary | ICD-10-CM | POA: Diagnosis not present

## 2021-09-16 DIAGNOSIS — I639 Cerebral infarction, unspecified: Secondary | ICD-10-CM | POA: Diagnosis not present

## 2021-10-14 DIAGNOSIS — Z952 Presence of prosthetic heart valve: Secondary | ICD-10-CM | POA: Diagnosis not present

## 2021-10-14 DIAGNOSIS — Z7901 Long term (current) use of anticoagulants: Secondary | ICD-10-CM | POA: Diagnosis not present

## 2021-10-14 DIAGNOSIS — I4821 Permanent atrial fibrillation: Secondary | ICD-10-CM | POA: Diagnosis not present

## 2021-11-18 DIAGNOSIS — I351 Nonrheumatic aortic (valve) insufficiency: Secondary | ICD-10-CM | POA: Diagnosis not present

## 2021-11-18 DIAGNOSIS — Z7901 Long term (current) use of anticoagulants: Secondary | ICD-10-CM | POA: Diagnosis not present

## 2021-11-18 DIAGNOSIS — I4821 Permanent atrial fibrillation: Secondary | ICD-10-CM | POA: Diagnosis not present

## 2021-12-16 ENCOUNTER — Other Ambulatory Visit: Payer: Self-pay | Admitting: Cardiology

## 2021-12-16 DIAGNOSIS — I10 Essential (primary) hypertension: Secondary | ICD-10-CM

## 2021-12-23 DIAGNOSIS — Z7901 Long term (current) use of anticoagulants: Secondary | ICD-10-CM | POA: Diagnosis not present

## 2021-12-23 DIAGNOSIS — I351 Nonrheumatic aortic (valve) insufficiency: Secondary | ICD-10-CM | POA: Diagnosis not present

## 2021-12-23 DIAGNOSIS — Z952 Presence of prosthetic heart valve: Secondary | ICD-10-CM | POA: Diagnosis not present

## 2021-12-23 DIAGNOSIS — I4821 Permanent atrial fibrillation: Secondary | ICD-10-CM | POA: Diagnosis not present

## 2022-01-20 DIAGNOSIS — Z7901 Long term (current) use of anticoagulants: Secondary | ICD-10-CM | POA: Diagnosis not present

## 2022-01-20 DIAGNOSIS — Z952 Presence of prosthetic heart valve: Secondary | ICD-10-CM | POA: Diagnosis not present

## 2022-01-20 DIAGNOSIS — I4821 Permanent atrial fibrillation: Secondary | ICD-10-CM | POA: Diagnosis not present

## 2022-01-21 ENCOUNTER — Other Ambulatory Visit: Payer: Self-pay | Admitting: Cardiology

## 2022-01-21 ENCOUNTER — Telehealth: Payer: Self-pay | Admitting: Cardiology

## 2022-01-21 DIAGNOSIS — Z952 Presence of prosthetic heart valve: Secondary | ICD-10-CM

## 2022-01-21 DIAGNOSIS — I4821 Permanent atrial fibrillation: Secondary | ICD-10-CM

## 2022-01-21 NOTE — Telephone Encounter (Signed)
Order has been extended.

## 2022-01-21 NOTE — Telephone Encounter (Signed)
Patient's echo had to be rescheduled due to Tiffany not being here on 02/09/22. Her order expires before then, she has been rescheduled for 02/24/12. Can we extend the order or place a new one?

## 2022-02-09 ENCOUNTER — Other Ambulatory Visit: Payer: Medicare PPO

## 2022-02-17 ENCOUNTER — Ambulatory Visit: Payer: Medicare PPO | Admitting: Cardiology

## 2022-02-23 ENCOUNTER — Ambulatory Visit: Payer: Medicare PPO

## 2022-02-23 DIAGNOSIS — Z952 Presence of prosthetic heart valve: Secondary | ICD-10-CM | POA: Diagnosis not present

## 2022-02-23 DIAGNOSIS — I4821 Permanent atrial fibrillation: Secondary | ICD-10-CM | POA: Diagnosis not present

## 2022-02-24 ENCOUNTER — Ambulatory Visit: Payer: Medicare PPO | Admitting: Cardiology

## 2022-02-25 DIAGNOSIS — H524 Presbyopia: Secondary | ICD-10-CM | POA: Diagnosis not present

## 2022-02-25 DIAGNOSIS — H35363 Drusen (degenerative) of macula, bilateral: Secondary | ICD-10-CM | POA: Diagnosis not present

## 2022-02-25 DIAGNOSIS — H35033 Hypertensive retinopathy, bilateral: Secondary | ICD-10-CM | POA: Diagnosis not present

## 2022-02-25 DIAGNOSIS — H40013 Open angle with borderline findings, low risk, bilateral: Secondary | ICD-10-CM | POA: Diagnosis not present

## 2022-03-01 ENCOUNTER — Encounter: Payer: Self-pay | Admitting: Cardiology

## 2022-03-01 ENCOUNTER — Ambulatory Visit: Payer: Medicare PPO | Admitting: Cardiology

## 2022-03-01 VITALS — BP 156/70 | HR 63 | Temp 98.0°F | Resp 16 | Ht 65.0 in | Wt 147.0 lb

## 2022-03-01 DIAGNOSIS — I10 Essential (primary) hypertension: Secondary | ICD-10-CM

## 2022-03-01 DIAGNOSIS — Z952 Presence of prosthetic heart valve: Secondary | ICD-10-CM

## 2022-03-01 NOTE — Progress Notes (Unsigned)
Follow up visit  Subjective:   Miranda Parsons, female    DOB: Jan 30, 1935, 87 y.o.   MRN: [id number]   Chief Complaint  Patient presents with   Hypertension   Follow-up    1 year   Results    echo    HPI  87 y/o [place] American female with h/o rheumatic mitral valve disease, s/p mechanical valve replacement (1989), hypertension, venous insufficiency.  Patient is doing well. She denies chest pain, shortness of breath, palpitations, orthopnea, PND, TIA/syncope. Leg edema is mild and stable. Reviewed recent test results with the patient, details below. BP is elevated today, lower at home.     Current Outpatient Medications:    alendronate (FOSAMAX) 70 MG tablet, once a week., Disp: , Rfl:    atenolol (TENORMIN) 25 MG tablet, Take 1 tablet by mouth daily., Disp: , Rfl:    atorvastatin (LIPITOR) 80 MG tablet, Take 80 mg by mouth daily., Disp: , Rfl:    Calcium 500-125 MG-UNIT TABS, Take 1 tablet by mouth once a week. , Disp: , Rfl:    docusate sodium (COLACE) 100 MG capsule, Take 100 mg by mouth daily., Disp: , Rfl:    ergocalciferol (VITAMIN D2) 50000 units capsule, Take 50,000 Units by mouth once a week., Disp: , Rfl:    furosemide (LASIX) 40 MG tablet, Take 40 mg by mouth daily. , Disp: , Rfl:    Horse Chestnut 300 MG CAPS, Take 1 capsule by mouth daily., Disp: , Rfl:    ipratropium (ATROVENT) 0.03 % nasal spray, take 1-2 sprays into each nostril twice daily as needed for congestion Nasal for 90 days, Disp: , Rfl:    losartan (COZAAR) 100 MG tablet, TAKE 1 TABLET EVERY DAY, Disp: 90 tablet, Rfl: 3   spironolactone (ALDACTONE) 25 MG tablet, TAKE 1 TABLET EVERY DAY, Disp: 90 tablet, Rfl: 3   vitamin B-12 (CYANOCOBALAMIN) 500 MCG tablet, Take 500 mcg by mouth once a week., Disp: , Rfl:    warfarin (COUMADIN) 3 MG tablet, Take 3 mg by mouth daily. , Disp: , Rfl:   Cardiovascular studies:  Echocardiogram 02/23/2022:  Normal LV systolic function with visual EF 55-60%. Left  ventricle cavity  is normal in size. Moderate concentric hypertrophy of the left ventricle.  Normal global wall motion. Indeterminate diastolic filling pattern,  elevated LAP. Calculated EF 59%.  Left atrial cavity is severely dilated at 178.7 ml/m^2.  Right atrial cavity is severely dilated.  Structurally normal trileaflet aortic valve.  Mild (Grade I) aortic  regurgitation.  Mechanical mitral valve.  Trace mitral regurgitation.  Structurally normal tricuspid valve.  Severe tricuspid regurgitation.  Structurally normal pulmonic valve.  Mild to moderate pulmonic  regurgitation.  no significant change since 2022.   EKG 03/01/2022: Atrial fibrillation 87 bpm  Nonspecific ST depression   Recent labs: Jan-March2023: Glucose 105. BUN/Cr 17/1.5. eGFr 30. K 4.2.  TSH 2.7  12/2020: A1C 5%  12/20/2019: Glucose 103, BUN/Cr 14/1.5. EGFR 33.  HbA1C 5.0% Chol 161, TG 106, HDL 64, LDL 76 TSH 3.1 normal  Review of Systems  Cardiovascular:  Negative for chest pain, dyspnea on exertion, leg swelling, palpitations and syncope.        Vitals:   03/01/22 1119  BP: (!) 156/70  Pulse: 63  Resp: 16  Temp: 98 F (36.7 C)  SpO2: 95%    Body mass index is 24.46 kg/m. Filed Weights   03/01/22 1119  Weight: 147 lb (66.7 kg)     Objective:  Physical Exam Vitals and nursing note reviewed.  Constitutional:      General: She is not in acute distress. Neck:     Vascular: No JVD.  Cardiovascular:     Rate and Rhythm: Normal rate and regular rhythm.     Heart sounds: No murmur heard.    Comments: Metallic S1 Pulmonary:     Effort: Pulmonary effort is normal.     Breath sounds: Normal breath sounds. No wheezing or rales.  Musculoskeletal:     Right lower leg: Edema (Trace) present.     Left lower leg: Edema (Trace) present.           Assessment & Recommendations:   87 y/o [place] American female with h/o rheumatic mitral valve disease, s/p mechanical valve replacement  (1989), hypertension, venous insufficiency.  Hypertension: Suspect whitecoat hypertension.  No changes made to antihypertensive therapy today. Her EGFR is 30.  If any further drop in EGFR, recommend stopping spironolactone.  Mild PH. Mild unchanged leg edema.  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   Unchanged severe left and right atrial enlargement, likely result of longstanding permanent atrial fibrillation, and history of rheumatic heart disease.  Clinically, no significant change.  Permanent valvular atrial fibrillation: Rate controlled. Continue warfarin, INR managed by PCP.  Ideally, aspirin and warfarin both recommended after mechanical valve replacement.  However, patient has done well without aspirin for over 20 years.  Given her advanced age and risk of bleeding, reasonable to forego aspirin.  Repeat echocardiogram and f/u in 1 year   Nigel Mormon, MD Pager: [phone] Office: [phone]

## 2022-03-02 ENCOUNTER — Encounter: Payer: Self-pay | Admitting: Cardiology

## 2022-03-04 DIAGNOSIS — R7302 Impaired glucose tolerance (oral): Secondary | ICD-10-CM | POA: Diagnosis not present

## 2022-03-04 DIAGNOSIS — I10 Essential (primary) hypertension: Secondary | ICD-10-CM | POA: Diagnosis not present

## 2022-03-04 DIAGNOSIS — E559 Vitamin D deficiency, unspecified: Secondary | ICD-10-CM | POA: Diagnosis not present

## 2022-03-04 DIAGNOSIS — R7989 Other specified abnormal findings of blood chemistry: Secondary | ICD-10-CM | POA: Diagnosis not present

## 2022-03-04 DIAGNOSIS — E785 Hyperlipidemia, unspecified: Secondary | ICD-10-CM | POA: Diagnosis not present

## 2022-03-04 DIAGNOSIS — N1832 Chronic kidney disease, stage 3b: Secondary | ICD-10-CM | POA: Diagnosis not present

## 2022-03-17 DIAGNOSIS — Z1231 Encounter for screening mammogram for malignant neoplasm of breast: Secondary | ICD-10-CM | POA: Diagnosis not present

## 2022-03-24 DIAGNOSIS — R928 Other abnormal and inconclusive findings on diagnostic imaging of breast: Secondary | ICD-10-CM | POA: Diagnosis not present

## 2022-03-26 DIAGNOSIS — Z952 Presence of prosthetic heart valve: Secondary | ICD-10-CM | POA: Diagnosis not present

## 2022-03-26 DIAGNOSIS — Z0000 Encounter for general adult medical examination without abnormal findings: Secondary | ICD-10-CM | POA: Diagnosis not present

## 2022-03-26 DIAGNOSIS — N1832 Chronic kidney disease, stage 3b: Secondary | ICD-10-CM | POA: Diagnosis not present

## 2022-03-26 DIAGNOSIS — Z7901 Long term (current) use of anticoagulants: Secondary | ICD-10-CM | POA: Diagnosis not present

## 2022-03-26 DIAGNOSIS — I10 Essential (primary) hypertension: Secondary | ICD-10-CM | POA: Diagnosis not present

## 2022-03-26 DIAGNOSIS — I4821 Permanent atrial fibrillation: Secondary | ICD-10-CM | POA: Diagnosis not present

## 2022-03-26 DIAGNOSIS — R82998 Other abnormal findings in urine: Secondary | ICD-10-CM | POA: Diagnosis not present

## 2022-03-26 DIAGNOSIS — Z1331 Encounter for screening for depression: Secondary | ICD-10-CM | POA: Diagnosis not present

## 2022-03-26 DIAGNOSIS — E785 Hyperlipidemia, unspecified: Secondary | ICD-10-CM | POA: Diagnosis not present

## 2022-03-26 DIAGNOSIS — M810 Age-related osteoporosis without current pathological fracture: Secondary | ICD-10-CM | POA: Diagnosis not present

## 2022-03-26 DIAGNOSIS — I272 Pulmonary hypertension, unspecified: Secondary | ICD-10-CM | POA: Diagnosis not present

## 2022-04-28 DIAGNOSIS — Z7901 Long term (current) use of anticoagulants: Secondary | ICD-10-CM | POA: Diagnosis not present

## 2022-04-28 DIAGNOSIS — Z952 Presence of prosthetic heart valve: Secondary | ICD-10-CM | POA: Diagnosis not present

## 2022-04-28 DIAGNOSIS — I4821 Permanent atrial fibrillation: Secondary | ICD-10-CM | POA: Diagnosis not present

## 2022-04-28 DIAGNOSIS — I351 Nonrheumatic aortic (valve) insufficiency: Secondary | ICD-10-CM | POA: Diagnosis not present

## 2022-06-02 DIAGNOSIS — I351 Nonrheumatic aortic (valve) insufficiency: Secondary | ICD-10-CM | POA: Diagnosis not present

## 2022-06-02 DIAGNOSIS — I4821 Permanent atrial fibrillation: Secondary | ICD-10-CM | POA: Diagnosis not present

## 2022-06-02 DIAGNOSIS — Z7901 Long term (current) use of anticoagulants: Secondary | ICD-10-CM | POA: Diagnosis not present

## 2022-06-30 DIAGNOSIS — Z7901 Long term (current) use of anticoagulants: Secondary | ICD-10-CM | POA: Diagnosis not present

## 2022-06-30 DIAGNOSIS — Z952 Presence of prosthetic heart valve: Secondary | ICD-10-CM | POA: Diagnosis not present

## 2022-06-30 DIAGNOSIS — I4821 Permanent atrial fibrillation: Secondary | ICD-10-CM | POA: Diagnosis not present

## 2022-08-11 DIAGNOSIS — Z952 Presence of prosthetic heart valve: Secondary | ICD-10-CM | POA: Diagnosis not present

## 2022-08-11 DIAGNOSIS — I4821 Permanent atrial fibrillation: Secondary | ICD-10-CM | POA: Diagnosis not present

## 2022-08-11 DIAGNOSIS — Z7901 Long term (current) use of anticoagulants: Secondary | ICD-10-CM | POA: Diagnosis not present

## 2022-09-08 DIAGNOSIS — I4821 Permanent atrial fibrillation: Secondary | ICD-10-CM | POA: Diagnosis not present

## 2022-09-08 DIAGNOSIS — N1832 Chronic kidney disease, stage 3b: Secondary | ICD-10-CM | POA: Diagnosis not present

## 2022-09-08 DIAGNOSIS — Z7901 Long term (current) use of anticoagulants: Secondary | ICD-10-CM | POA: Diagnosis not present

## 2022-09-08 DIAGNOSIS — Z952 Presence of prosthetic heart valve: Secondary | ICD-10-CM | POA: Diagnosis not present

## 2022-09-14 DIAGNOSIS — I129 Hypertensive chronic kidney disease with stage 1 through stage 4 chronic kidney disease, or unspecified chronic kidney disease: Secondary | ICD-10-CM | POA: Diagnosis not present

## 2022-09-14 DIAGNOSIS — I482 Chronic atrial fibrillation, unspecified: Secondary | ICD-10-CM | POA: Diagnosis not present

## 2022-09-14 DIAGNOSIS — E785 Hyperlipidemia, unspecified: Secondary | ICD-10-CM | POA: Diagnosis not present

## 2022-09-14 DIAGNOSIS — I639 Cerebral infarction, unspecified: Secondary | ICD-10-CM | POA: Diagnosis not present

## 2022-09-14 DIAGNOSIS — N1832 Chronic kidney disease, stage 3b: Secondary | ICD-10-CM | POA: Diagnosis not present

## 2022-10-20 DIAGNOSIS — I4821 Permanent atrial fibrillation: Secondary | ICD-10-CM | POA: Diagnosis not present

## 2022-10-20 DIAGNOSIS — Z952 Presence of prosthetic heart valve: Secondary | ICD-10-CM | POA: Diagnosis not present

## 2022-10-20 DIAGNOSIS — Z7901 Long term (current) use of anticoagulants: Secondary | ICD-10-CM | POA: Diagnosis not present

## 2022-12-01 DIAGNOSIS — I4821 Permanent atrial fibrillation: Secondary | ICD-10-CM | POA: Diagnosis not present

## 2022-12-01 DIAGNOSIS — Z952 Presence of prosthetic heart valve: Secondary | ICD-10-CM | POA: Diagnosis not present

## 2022-12-01 DIAGNOSIS — Z7901 Long term (current) use of anticoagulants: Secondary | ICD-10-CM | POA: Diagnosis not present

## 2022-12-01 DIAGNOSIS — E559 Vitamin D deficiency, unspecified: Secondary | ICD-10-CM | POA: Diagnosis not present

## 2022-12-18 IMAGING — US US RENAL
1 series · 14 of 25 positions shown · non-contrast
Comparison: None.

CLINICAL DATA: Chronic kidney disease stage 3 B

EXAM:
RENAL / URINARY TRACT ULTRASOUND COMPLETE

[Series 1: us renal · 0.20mm/px · 14 of 49 slices shown]
[im 1/49]
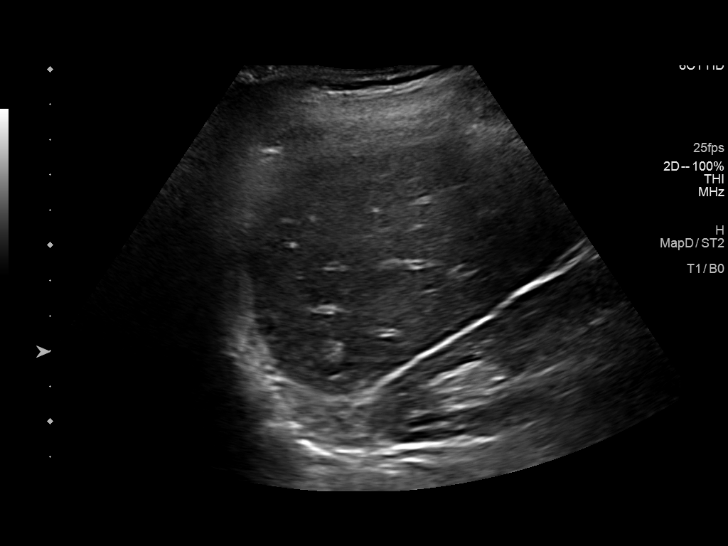
[im 5/49]
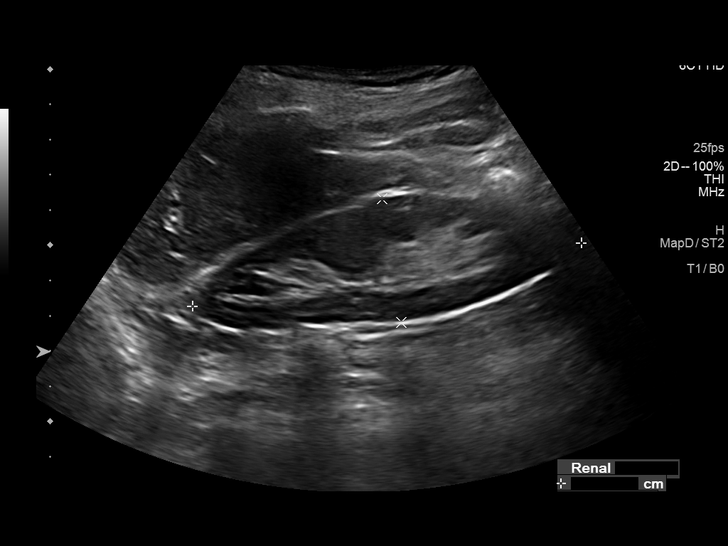
[im 9/49]
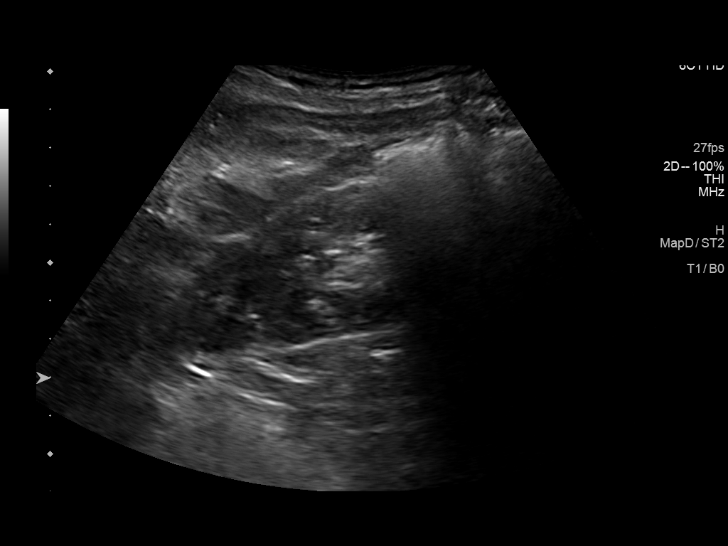
[im 13/49]
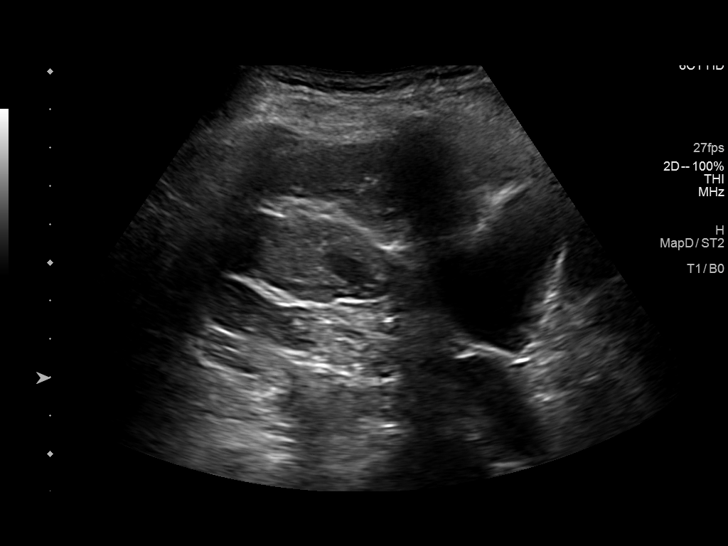
[im 17/49]
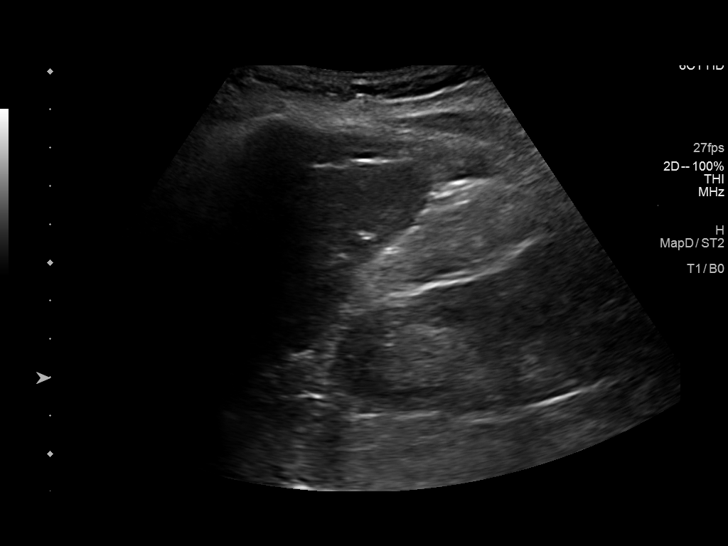
[im 19/49]
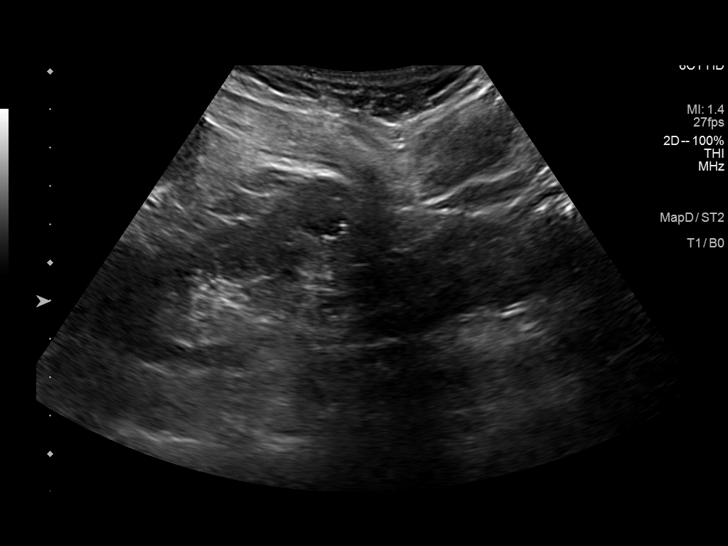
[im 23/49]
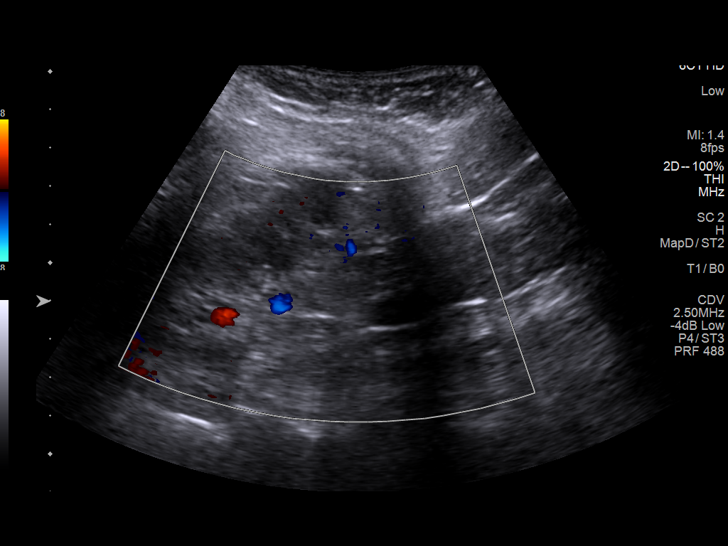
[im 27/49]
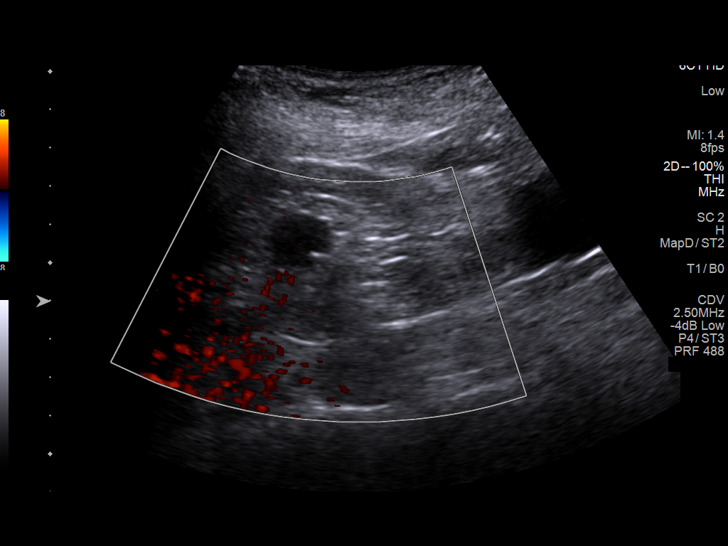
[im 31/49]
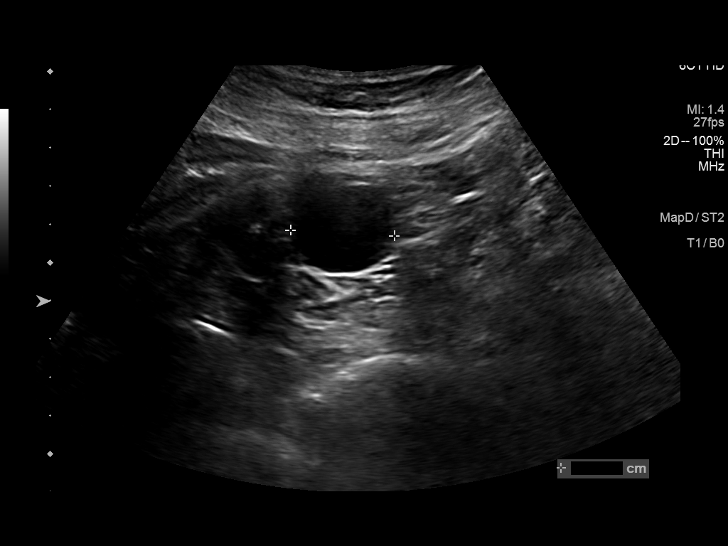
[im 33/49]
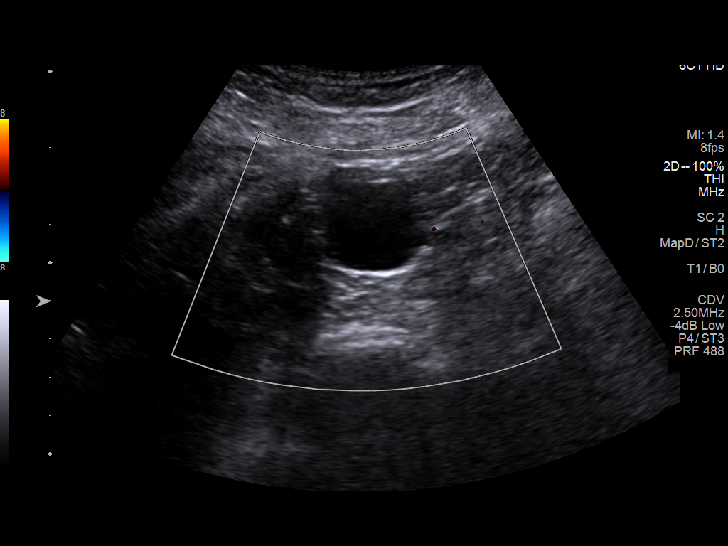
[im 37/49]
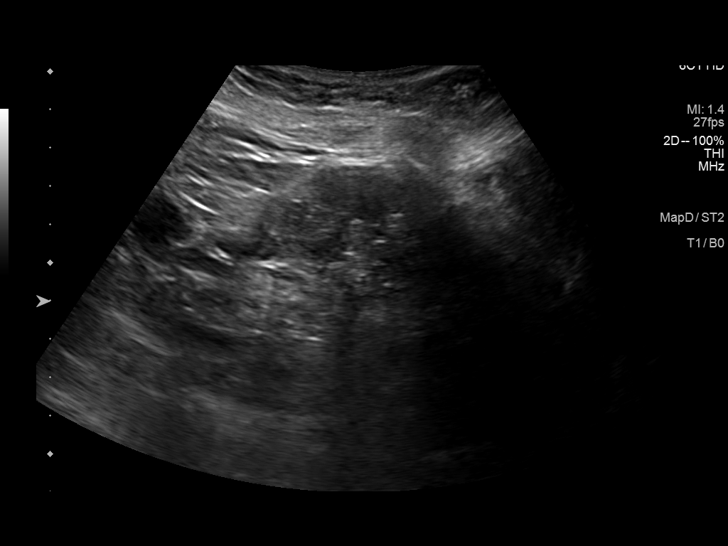
[im 41/49]
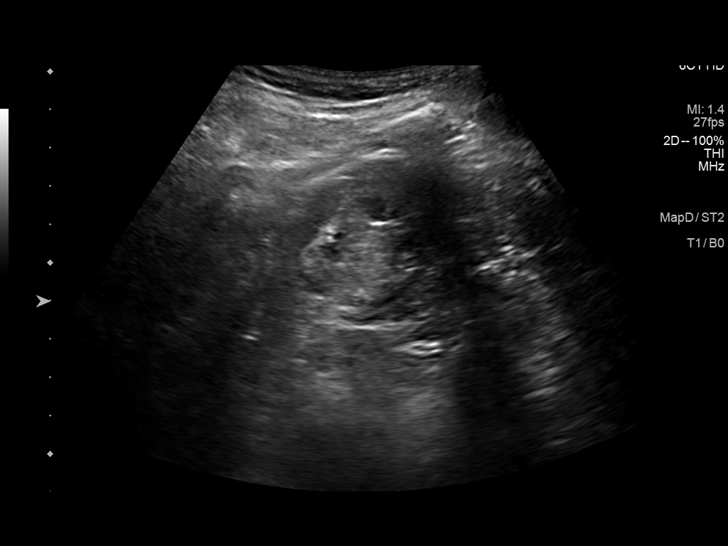
[im 45/49]
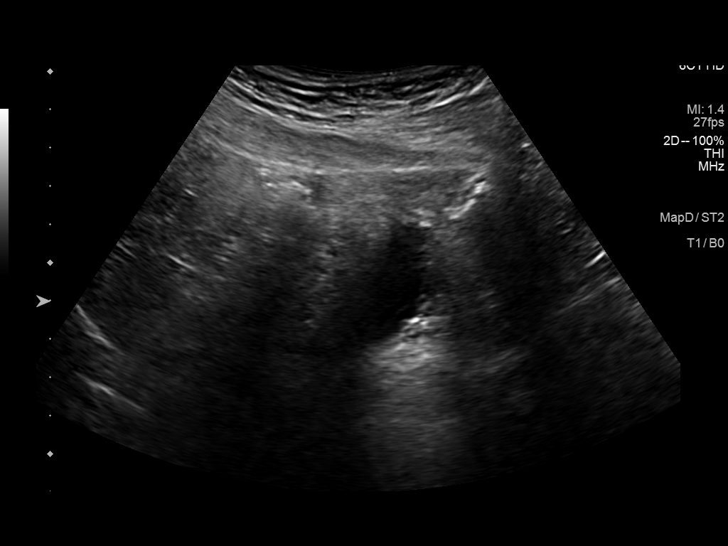
[im 49/49]
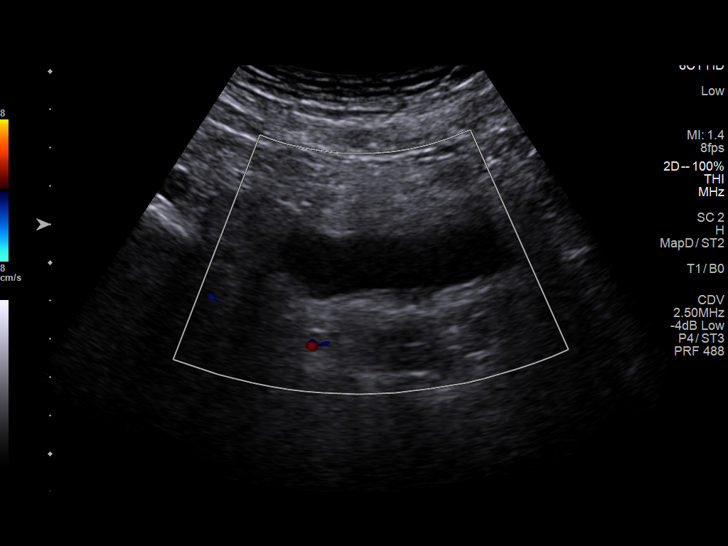

[14 of 25 positions shown; findings below may reference images not displayed]

FINDINGS: Right Kidney:

Renal measurements: 11.2 x 3.6 x 5.0 cm = volume: 105 mL.
Echogenicity within normal limits. No mass or hydronephrosis
visualized.

Left Kidney:

Renal measurements: 12.0 x 4.3 x 3.9 cm = volume: 105 mL.
Echogenicity within normal limits. No mass or hydronephrosis
visualized. Two simple cysts are present measuring 2.7 and 1.7 cm.

Bladder:

Appears normal for degree of bladder distention.

Other:

None.
IMPRESSION: No significant sonographic abnormality of the kidneys.

## 2022-12-22 DIAGNOSIS — N1832 Chronic kidney disease, stage 3b: Secondary | ICD-10-CM | POA: Diagnosis not present

## 2022-12-22 DIAGNOSIS — N189 Chronic kidney disease, unspecified: Secondary | ICD-10-CM | POA: Diagnosis not present

## 2022-12-31 DIAGNOSIS — I482 Chronic atrial fibrillation, unspecified: Secondary | ICD-10-CM | POA: Diagnosis not present

## 2022-12-31 DIAGNOSIS — I639 Cerebral infarction, unspecified: Secondary | ICD-10-CM | POA: Diagnosis not present

## 2022-12-31 DIAGNOSIS — E559 Vitamin D deficiency, unspecified: Secondary | ICD-10-CM | POA: Diagnosis not present

## 2022-12-31 DIAGNOSIS — E785 Hyperlipidemia, unspecified: Secondary | ICD-10-CM | POA: Diagnosis not present

## 2022-12-31 DIAGNOSIS — I129 Hypertensive chronic kidney disease with stage 1 through stage 4 chronic kidney disease, or unspecified chronic kidney disease: Secondary | ICD-10-CM | POA: Diagnosis not present

## 2022-12-31 DIAGNOSIS — N1832 Chronic kidney disease, stage 3b: Secondary | ICD-10-CM | POA: Diagnosis not present

## 2023-01-12 DIAGNOSIS — Z952 Presence of prosthetic heart valve: Secondary | ICD-10-CM | POA: Diagnosis not present

## 2023-01-12 DIAGNOSIS — Z7901 Long term (current) use of anticoagulants: Secondary | ICD-10-CM | POA: Diagnosis not present

## 2023-01-12 DIAGNOSIS — I4821 Permanent atrial fibrillation: Secondary | ICD-10-CM | POA: Diagnosis not present

## 2023-02-21 ENCOUNTER — Other Ambulatory Visit: Payer: Medicare PPO

## 2023-02-21 ENCOUNTER — Ambulatory Visit: Payer: Medicare PPO

## 2023-02-21 DIAGNOSIS — I2510 Atherosclerotic heart disease of native coronary artery without angina pectoris: Secondary | ICD-10-CM | POA: Diagnosis not present

## 2023-02-21 DIAGNOSIS — Z952 Presence of prosthetic heart valve: Secondary | ICD-10-CM | POA: Diagnosis not present

## 2023-02-21 DIAGNOSIS — I10 Essential (primary) hypertension: Secondary | ICD-10-CM | POA: Diagnosis not present

## 2023-02-23 DIAGNOSIS — Z7901 Long term (current) use of anticoagulants: Secondary | ICD-10-CM | POA: Diagnosis not present

## 2023-02-23 DIAGNOSIS — I4821 Permanent atrial fibrillation: Secondary | ICD-10-CM | POA: Diagnosis not present

## 2023-02-23 DIAGNOSIS — Z952 Presence of prosthetic heart valve: Secondary | ICD-10-CM | POA: Diagnosis not present

## 2023-02-25 NOTE — Progress Notes (Signed)
Tried calling patient no answer.

## 2023-02-28 DIAGNOSIS — I4821 Permanent atrial fibrillation: Secondary | ICD-10-CM | POA: Diagnosis not present

## 2023-02-28 DIAGNOSIS — H04123 Dry eye syndrome of bilateral lacrimal glands: Secondary | ICD-10-CM | POA: Diagnosis not present

## 2023-02-28 DIAGNOSIS — H35363 Drusen (degenerative) of macula, bilateral: Secondary | ICD-10-CM | POA: Diagnosis not present

## 2023-02-28 DIAGNOSIS — Z7901 Long term (current) use of anticoagulants: Secondary | ICD-10-CM | POA: Diagnosis not present

## 2023-02-28 DIAGNOSIS — H40013 Open angle with borderline findings, low risk, bilateral: Secondary | ICD-10-CM | POA: Diagnosis not present

## 2023-02-28 DIAGNOSIS — H35033 Hypertensive retinopathy, bilateral: Secondary | ICD-10-CM | POA: Diagnosis not present

## 2023-02-28 DIAGNOSIS — Z952 Presence of prosthetic heart valve: Secondary | ICD-10-CM | POA: Diagnosis not present

## 2023-03-01 NOTE — Progress Notes (Signed)
Called patient no answer left a vm

## 2023-03-02 ENCOUNTER — Ambulatory Visit: Payer: Medicare PPO | Admitting: Cardiology

## 2023-03-03 NOTE — Progress Notes (Signed)
Called patient no answer, left a vm

## 2023-03-04 ENCOUNTER — Other Ambulatory Visit: Payer: Self-pay | Admitting: Cardiology

## 2023-03-04 DIAGNOSIS — I10 Essential (primary) hypertension: Secondary | ICD-10-CM

## 2023-03-09 ENCOUNTER — Ambulatory Visit: Payer: Medicare PPO | Admitting: Cardiology

## 2023-03-14 DIAGNOSIS — I4821 Permanent atrial fibrillation: Secondary | ICD-10-CM | POA: Diagnosis not present

## 2023-03-14 DIAGNOSIS — Z7901 Long term (current) use of anticoagulants: Secondary | ICD-10-CM | POA: Diagnosis not present

## 2023-03-14 DIAGNOSIS — Z952 Presence of prosthetic heart valve: Secondary | ICD-10-CM | POA: Diagnosis not present

## 2023-03-23 DIAGNOSIS — Z1231 Encounter for screening mammogram for malignant neoplasm of breast: Secondary | ICD-10-CM | POA: Diagnosis not present

## 2023-03-28 DIAGNOSIS — E785 Hyperlipidemia, unspecified: Secondary | ICD-10-CM | POA: Diagnosis not present

## 2023-03-28 DIAGNOSIS — E559 Vitamin D deficiency, unspecified: Secondary | ICD-10-CM | POA: Diagnosis not present

## 2023-03-28 DIAGNOSIS — I10 Essential (primary) hypertension: Secondary | ICD-10-CM | POA: Diagnosis not present

## 2023-03-28 DIAGNOSIS — I272 Pulmonary hypertension, unspecified: Secondary | ICD-10-CM | POA: Diagnosis not present

## 2023-03-28 DIAGNOSIS — Z0000 Encounter for general adult medical examination without abnormal findings: Secondary | ICD-10-CM | POA: Diagnosis not present

## 2023-03-28 DIAGNOSIS — Z0189 Encounter for other specified special examinations: Secondary | ICD-10-CM | POA: Diagnosis not present

## 2023-03-31 DIAGNOSIS — I38 Endocarditis, valve unspecified: Secondary | ICD-10-CM | POA: Insufficient documentation

## 2023-03-31 NOTE — Progress Notes (Signed)
Cardiology Office Note:    Date:  04/01/2023  ID:  FARIDA PENTZ, DOB [birth year], MRN [id number] PCP: Garlan Fillers, MD  [facility] Providers Cardiologist:  Elder Negus, MD       Patient Profile:      Permanent atrial fibrillation  Rheumatic MV disease s/p mechanical MVR 1989  TTE 02/21/23: EF 73, severe BAE, mechanical MV, trace MR, mod to severe TR, mild PI, no pul HTN Coronary artery disease (no details in available records) Pulmonary hypertension  Hx of TIA  Hx of CVA in 2001  Hypertension  Hyperlipidemia  Chronic kidney disease  Venous insufficiency         History of Present Illness:  Discussed the use of AI scribe software for clinical note transcription with the patient, who gave verbal consent to proceed.  Miranda Parsons is a 88 y.o. female who returns for follow up of valvular heart disease s/p mechanical MVR, atrial fibrillation. She was last seen by Dr. Rosemary Holms in 02/2022. She had a recent echocardiogram last month which did not demonstrate any significant change from the prior study.  She is here alone. The patient reports recovering from a recent COVID-19 infection, with ongoing symptoms of fatigue and loss of taste. She notes gradual improvement in these symptoms. The patient denies any chest pain, shortness of breath, syncope, bleeding, black stools, or bloody urine. The patient's Coumadin is managed by her primary care provider. She has chronic leg swelling since her heart surgery. She has not had syncope.      Review of Systems  Gastrointestinal:  Negative for hematochezia and melena.  Genitourinary:  Negative for hematuria.  See HPI    Studies Reviewed:   EKG Interpretation Date/Time:  Friday April 01 2023 10:54:36 EDT Ventricular Rate:  61 PR Interval:    QRS Duration:  68 QT Interval:  474 QTC Calculation: 477 R Axis:   85  Text Interpretation: Atrial fibrillation Nonspecific ST and T wave abnormality No significant change  since last tracing Confirmed by Tereso Newcomer [phone]) on 04/01/2023 11:11:15 AM    Results   LABS Total cholesterol: 153 mg/dL (19/14/7829) HDL: 67 mg/dL (56/21/3086) LDL: 70 mg/dL (57/84/6962) Triglycerides: 79 mg/dL (95/28/4132) Hemoglobin: 11.5 g/dL (44/06/270) Creatinine: 2.02 mg/dL (53/66/4403) ALT: 16 U/L (03/04/2022) TSH: 2.42 IU/mL (03/04/2022)      Risk Assessment/Calculations:    CHA2DS2-VASc Score = 7   This indicates a 11.2% annual risk of stroke. The patient's score is based upon: CHF History: 0 HTN History: 1 Diabetes History: 0 Stroke History: 2 Vascular Disease History: 1 Age Score: 2 Gender Score: 1            Physical Exam:   VS:  BP (!) 116/42   Pulse 61   Ht 5\' 5"  (1.651 m)   Wt 136 lb (61.7 kg)   SpO2 94%   BMI 22.63 kg/m    Wt Readings from Last 3 Encounters:  04/01/23 136 lb (61.7 kg)  03/01/22 147 lb (66.7 kg)  02/09/21 146 lb (66.2 kg)    Constitutional:      Appearance: Healthy appearance. Not in distress.  Neck:     Vascular: No JVR. JVD normal.  Pulmonary:     Breath sounds: Normal breath sounds. No wheezing. No rales.  Cardiovascular:     Normal rate. Irregularly irregular rhythm. S1. mechanical     Murmurs: There is no murmur.  Edema:    Peripheral edema present.    Ankle: bilateral trace  edema of the ankle. Abdominal:     Palpations: Abdomen is soft.  Skin:    General: Skin is warm and dry.        Assessment and Plan:   Assessment & Plan Permanent atrial fibrillation (HCC) Stable on anticoagulation with Coumadin, managed by primary care. -Continue Atenolol 25mg  daily and Coumadin as directed by primary care. Valvular heart disease Moderate to severe tricuspid regurgitation on the most recent echocardiogram, stable compared to previous echocardiograms, with no significant shortness of breath or signs of right-sided heart failure.  S/P MVR (mitral valve replacement) The echocardiogram on 02/21/2023 showed the  mechanical mitral valve is functioning normally with no significant changes in tricuspid regurgitation.  -Continue Coumadin as directed by primary care -Continue SBE prophylaxis -Follow-up in 1 year with a repeat echocardiogram. Essential hypertension Blood pressure is controlled.  -Continue Atenolol 25mg  daily, Lasix 40mg  daily, Spironolactone 25mg  daily, and Losartan 100mg  daily. CAD in native artery No records are available, but she reports no chest discomfort suggestive of angina. LDL is optimal at 70 as of September 2023, with follow-up lipids pending with primary care. -Continue Lipitor 80mg  daily.        Dispo:  Return in about 1 year (around 03/31/2024) for Routine Follow Up, w/ Dr. Rosemary Holms.  Signed, Tereso Newcomer, PA-C

## 2023-04-01 ENCOUNTER — Encounter: Payer: Self-pay | Admitting: Physician Assistant

## 2023-04-01 ENCOUNTER — Ambulatory Visit: Payer: Medicare PPO | Attending: Physician Assistant | Admitting: Physician Assistant

## 2023-04-01 VITALS — BP 116/42 | HR 61 | Ht 65.0 in | Wt 136.0 lb

## 2023-04-01 DIAGNOSIS — Z952 Presence of prosthetic heart valve: Secondary | ICD-10-CM

## 2023-04-01 DIAGNOSIS — I38 Endocarditis, valve unspecified: Secondary | ICD-10-CM | POA: Diagnosis not present

## 2023-04-01 DIAGNOSIS — I10 Essential (primary) hypertension: Secondary | ICD-10-CM

## 2023-04-01 DIAGNOSIS — I2510 Atherosclerotic heart disease of native coronary artery without angina pectoris: Secondary | ICD-10-CM | POA: Diagnosis not present

## 2023-04-01 DIAGNOSIS — I4821 Permanent atrial fibrillation: Secondary | ICD-10-CM

## 2023-04-01 NOTE — Assessment & Plan Note (Signed)
Moderate to severe tricuspid regurgitation on the most recent echocardiogram, stable compared to previous echocardiograms, with no significant shortness of breath or signs of right-sided heart failure.

## 2023-04-01 NOTE — Assessment & Plan Note (Signed)
No records are available, but she reports no chest discomfort suggestive of angina. LDL is optimal at 70 as of September 2023, with follow-up lipids pending with primary care. -Continue Lipitor 80mg  daily.

## 2023-04-01 NOTE — Patient Instructions (Signed)
Medication Instructions:  Your physician recommends that you continue on your current medications as directed. Please refer to the Current Medication list given to you today.  *If you need a refill on your cardiac medications before your next appointment, please call your pharmacy*   Lab Work: None ordered  If you have labs (blood work) drawn today and your tests are completely normal, you will receive your results only by: MyChart Message (if you have MyChart) OR A paper copy in the mail If you have any lab test that is abnormal or we need to change your treatment, we will call you to review the results.   Testing/Procedures: Your physician has requested that you have an echocardiogram. Echocardiography is a painless test that uses sound waves to create images of your heart. It provides your doctor with information about the size and shape of your heart and how well your heart's chambers and valves are working. This procedure takes approximately one hour. There are no restrictions for this procedure. Please do NOT wear cologne, perfume, aftershave, or lotions (deodorant is allowed). Please arrive 15 minutes prior to your appointment time.    Follow-Up: At [facility], you and your health needs are our priority.  As part of our continuing mission to provide you with exceptional heart care, we have created designated Provider Care Teams.  These Care Teams include your primary Cardiologist (physician) and Advanced Practice Providers (APPs -  Physician Assistants and Nurse Practitioners) who all work together to provide you with the care you need, when you need it.  We recommend signing up for the patient portal called "MyChart".  Sign up information is provided on this After Visit Summary.  MyChart is used to connect with patients for Virtual Visits (Telemedicine).  Patients are able to view lab/test results, encounter notes, upcoming appointments, etc.  Non-urgent messages can be  sent to your provider as well.   To learn more about what you can do with MyChart, go to [email or web address].    Your next appointment:   12 month(s)  Provider:   Elder Negus, MD     Other Instructions

## 2023-04-01 NOTE — Assessment & Plan Note (Signed)
Stable on anticoagulation with Coumadin, managed by primary care. -Continue Atenolol 25mg  daily and Coumadin as directed by primary care.

## 2023-04-01 NOTE — Assessment & Plan Note (Signed)
The echocardiogram on 02/21/2023 showed the mechanical mitral valve is functioning normally with no significant changes in tricuspid regurgitation.  -Continue Coumadin as directed by primary care -Continue SBE prophylaxis -Follow-up in 1 year with a repeat echocardiogram.

## 2023-04-01 NOTE — Assessment & Plan Note (Signed)
Blood pressure is controlled.  -Continue Atenolol 25mg  daily, Lasix 40mg  daily, Spironolactone 25mg  daily, and Losartan 100mg  daily.

## 2023-04-02 IMAGING — CT CT ABD-PELV W/O CM
2 of 4 series · 13 of 46 positions shown, 15 images · non-contrast
Comparison: Renal ultrasound 04/16/2021

CLINICAL DATA: Weight loss.



[Series 2: routine abdomen pelvis without 5.00 br40 s3 axial · axial · non-contrast · 0.57mm/px · z∈[+1273,+1613]mm · 10 of 82 slices shown, 12 images]
[im 7/82  soft-tissue]
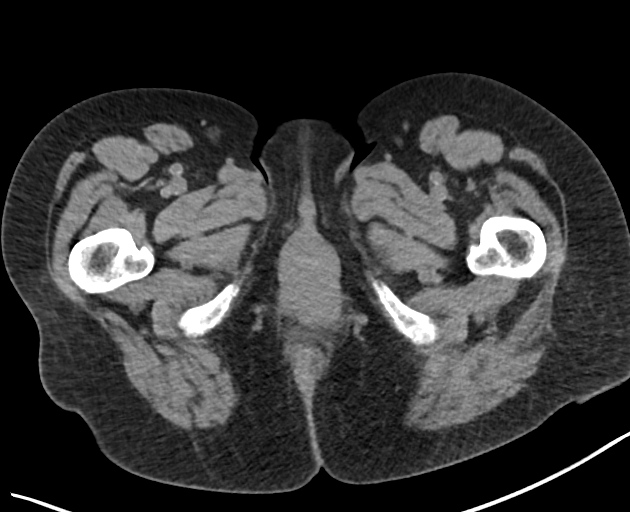
[im 7/82  bone]
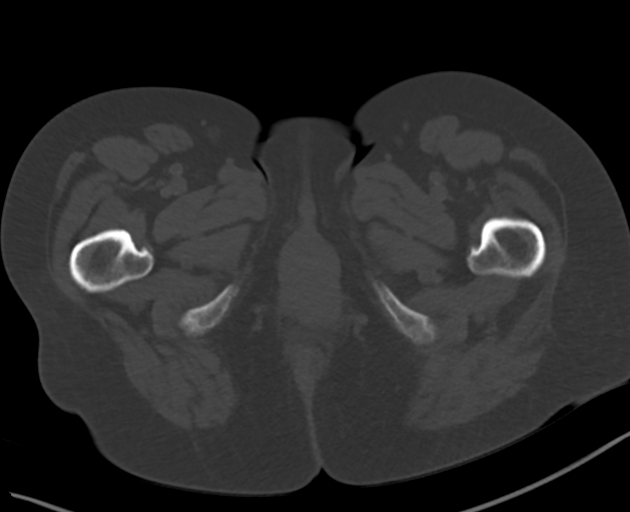
[im 14/82  soft-tissue]
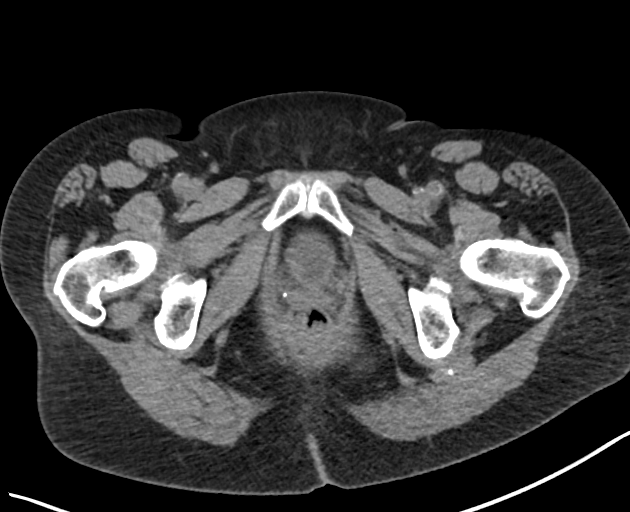
[im 21/82  soft-tissue]
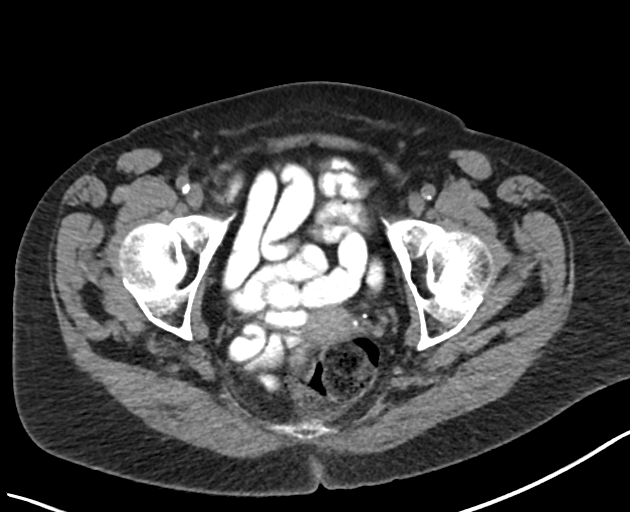
[im 31/82  soft-tissue]
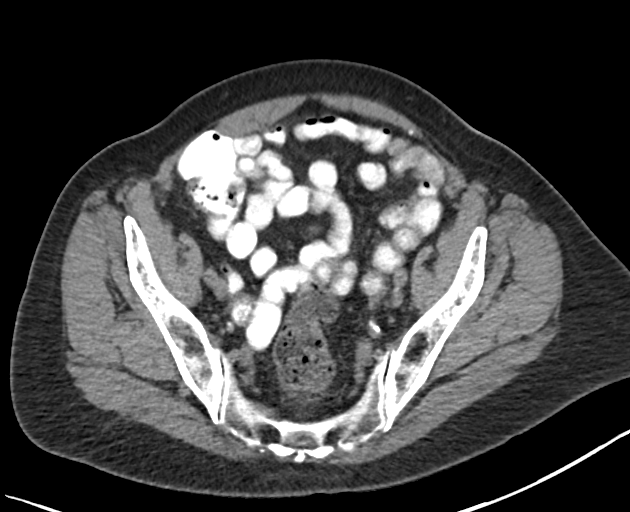
[im 38/82  soft-tissue]
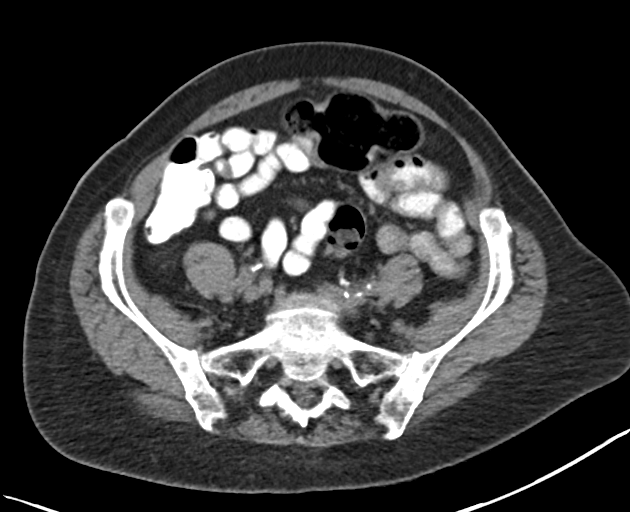
[im 44/82  soft-tissue]
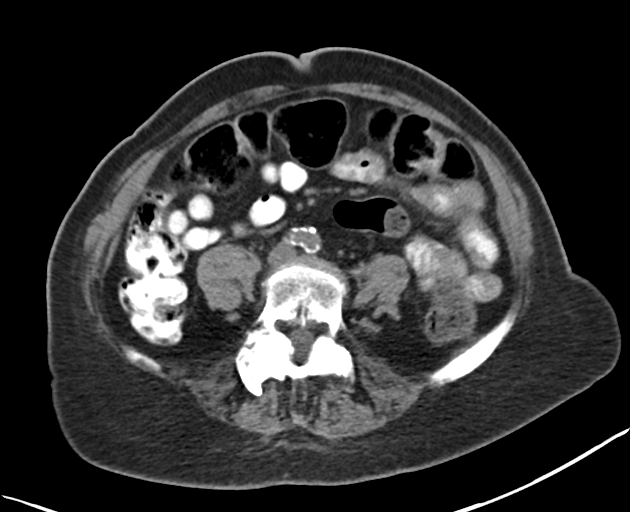
[im 51/82  soft-tissue]
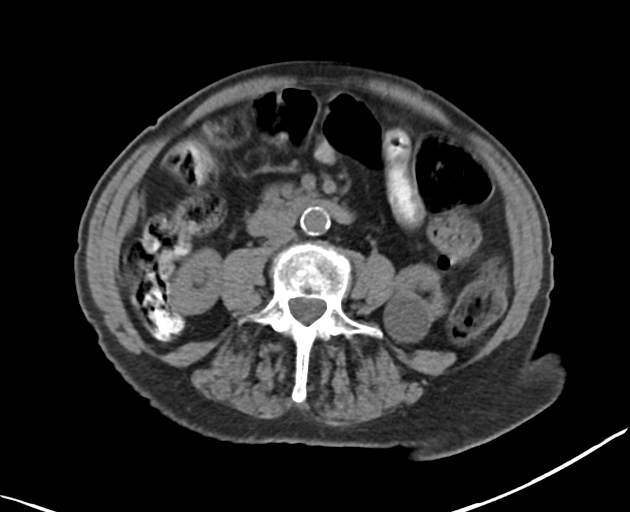
[im 61/82  soft-tissue]
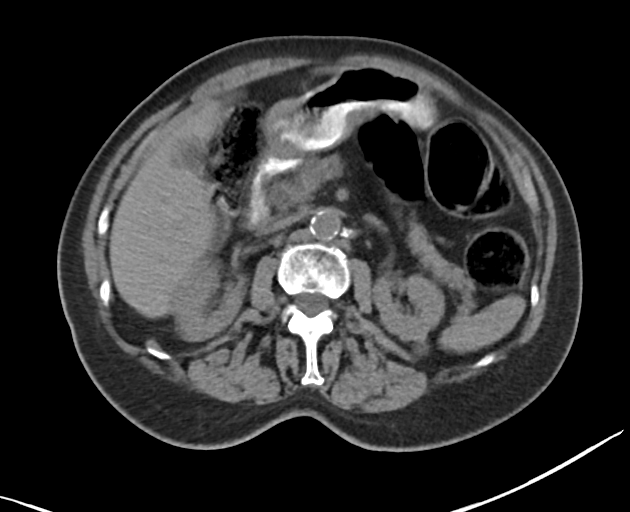
[im 68/82  soft-tissue]
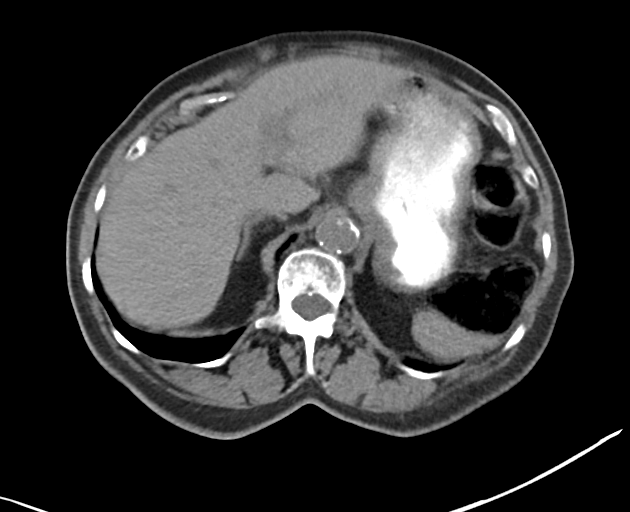
[im 68/82  bone]
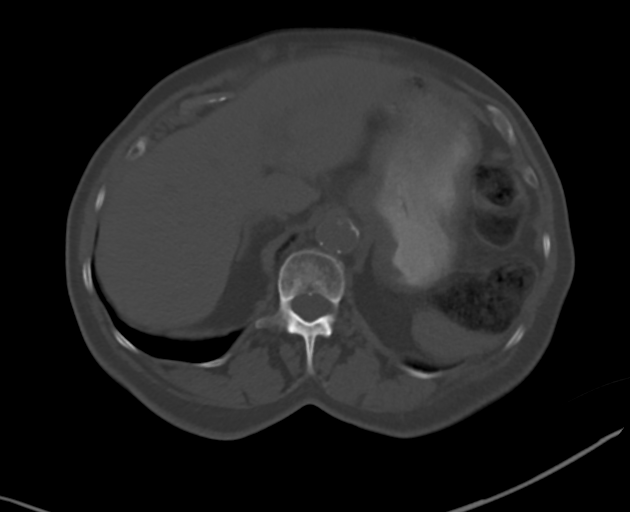
[im 75/82  soft-tissue]
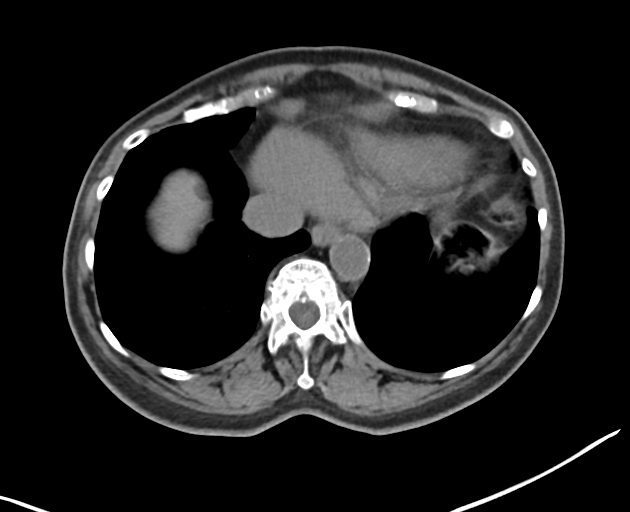

[Series 4: routine abdomen pelvis without 2.00 br40 s3 cor · coronal · non-contrast · 0.71mm/px · 3 of 147 slices shown]
[im 49/147  soft-tissue]
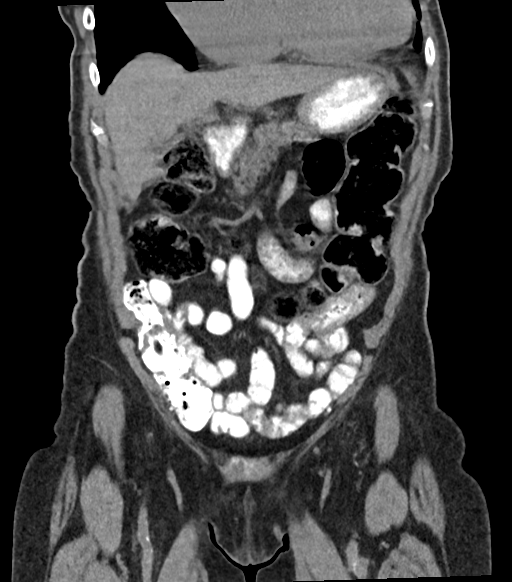
[im 65/147  soft-tissue]
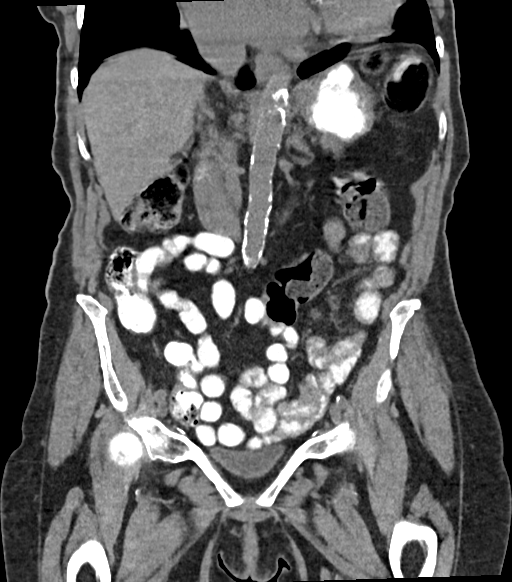
[im 82/147  soft-tissue]
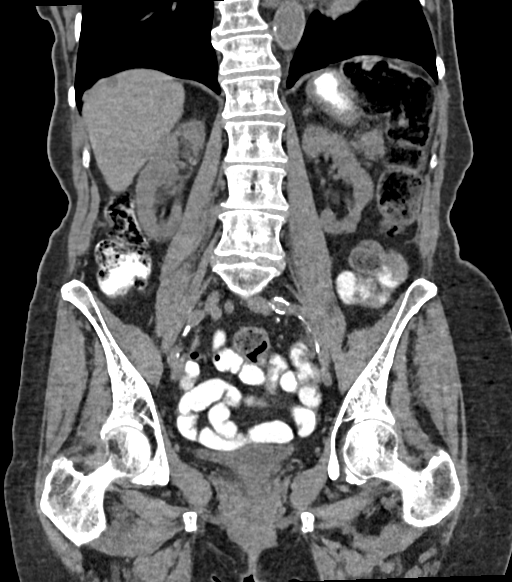

[13 of 46 positions shown; findings below may reference images not displayed]

FINDINGS: Lower chest: Linear subsegmental atelectasis in the right middle
lobe. Multi chamber cardiomegaly, only partially included in the
field of view. No basilar pleural effusion or confluent
consolidation.

Hepatobiliary: There are scattered small hepatic hypodensities that
are too small to accurately characterize. The largest is within the
right hepatic dome measuring 12 mm and measures simple fluid
density. The additional low-density lesions are subcentimeter and
not well assessed. There is motion artifact through the gallbladder
which limits assessment. No definite gallstone. No biliary
dilatation

Pancreas: Evidence of pancreatic mass on this unenhanced exam. No
ductal dilatation or inflammation.

Spleen: Small in size without focal abnormality.

Adrenals/Urinary Tract: No adrenal nodule there is mild bilateral
renal cortical thinning and atrophy, left greater than right.
Exophytic 2.7 cm water density lesion in the lower left kidney
consistent with cyst. Exophytic left upper pole lesion measuring 15
mm, also simple fluid density. There is no evidence of solid or
suspicious renal lesion on this unenhanced exam. No hydronephrosis
or renal calculi. Urinary bladder is minimally distended, but
unremarkable.

Stomach/Bowel: Small hiatal hernia and distal esophageal wall
thickening. Stomach is physiologically distended. Normal small bowel
without inflammation or obstruction. Normal appendix. Moderate to
large volume of stool throughout the colon. The descending colon is
redundant. There is no colonic wall thickening or evidence of
colonic mass.

Vascular/Lymphatic: Moderate aortic atherosclerosis. No aortic
aneurysm. No portal venous or mesenteric gas. No enlarged lymph
nodes in the abdomen or pelvis.

Reproductive: Normal for age uterine atrophy.  No adnexal mass.

Other: No ascites. No abdominal wall hernia. No omental thickening.

Musculoskeletal: The bones are subjectively under mineralized benign
Tarlov cysts in the sacrum. No focal bone lesion or acute osseous
findings.
IMPRESSION: 1. No explanation for weight loss.
2. Small hiatal hernia and distal esophageal wall thickening, can be
seen with reflux or esophagitis.
3. Moderate to large volume of stool throughout the colon with
colonic redundancy, suggestive of constipation. No bowel obstruction
or inflammation.
4. Left renal cysts.
5. Small cyst in the hepatic dome. Additional low-density lesions in
the liver are too small to accurately characterize, but likely
cysts.
6. Cardiomegaly.

Aortic Atherosclerosis ([id number]).

## 2023-04-04 ENCOUNTER — Other Ambulatory Visit: Payer: Self-pay

## 2023-04-04 DIAGNOSIS — Z0000 Encounter for general adult medical examination without abnormal findings: Secondary | ICD-10-CM | POA: Diagnosis not present

## 2023-04-04 DIAGNOSIS — E559 Vitamin D deficiency, unspecified: Secondary | ICD-10-CM | POA: Diagnosis not present

## 2023-04-04 DIAGNOSIS — I272 Pulmonary hypertension, unspecified: Secondary | ICD-10-CM | POA: Diagnosis not present

## 2023-04-04 DIAGNOSIS — Z7901 Long term (current) use of anticoagulants: Secondary | ICD-10-CM | POA: Diagnosis not present

## 2023-04-04 DIAGNOSIS — N1832 Chronic kidney disease, stage 3b: Secondary | ICD-10-CM | POA: Diagnosis not present

## 2023-04-04 DIAGNOSIS — Z952 Presence of prosthetic heart valve: Secondary | ICD-10-CM | POA: Diagnosis not present

## 2023-04-04 DIAGNOSIS — I4821 Permanent atrial fibrillation: Secondary | ICD-10-CM | POA: Diagnosis not present

## 2023-04-04 DIAGNOSIS — I10 Essential (primary) hypertension: Secondary | ICD-10-CM

## 2023-04-04 DIAGNOSIS — E785 Hyperlipidemia, unspecified: Secondary | ICD-10-CM | POA: Diagnosis not present

## 2023-04-04 DIAGNOSIS — R82998 Other abnormal findings in urine: Secondary | ICD-10-CM | POA: Diagnosis not present

## 2023-04-04 DIAGNOSIS — Z23 Encounter for immunization: Secondary | ICD-10-CM | POA: Diagnosis not present

## 2023-04-04 MED ORDER — LOSARTAN POTASSIUM 100 MG PO TABS: 100.0000 mg | ORAL_TABLET | Freq: Every day | ORAL | 3 refills | Status: DC

## 2023-05-03 ENCOUNTER — Other Ambulatory Visit (HOSPITAL_COMMUNITY): Payer: Medicare PPO

## 2023-05-18 DIAGNOSIS — I4821 Permanent atrial fibrillation: Secondary | ICD-10-CM | POA: Diagnosis not present

## 2023-05-18 DIAGNOSIS — I351 Nonrheumatic aortic (valve) insufficiency: Secondary | ICD-10-CM | POA: Diagnosis not present

## 2023-05-18 DIAGNOSIS — Z7901 Long term (current) use of anticoagulants: Secondary | ICD-10-CM | POA: Diagnosis not present

## 2023-05-18 DIAGNOSIS — Z952 Presence of prosthetic heart valve: Secondary | ICD-10-CM | POA: Diagnosis not present

## 2023-05-18 DIAGNOSIS — J310 Chronic rhinitis: Secondary | ICD-10-CM | POA: Diagnosis not present

## 2023-06-29 DIAGNOSIS — E785 Hyperlipidemia, unspecified: Secondary | ICD-10-CM | POA: Diagnosis not present

## 2023-06-29 DIAGNOSIS — I129 Hypertensive chronic kidney disease with stage 1 through stage 4 chronic kidney disease, or unspecified chronic kidney disease: Secondary | ICD-10-CM | POA: Diagnosis not present

## 2023-06-29 DIAGNOSIS — I482 Chronic atrial fibrillation, unspecified: Secondary | ICD-10-CM | POA: Diagnosis not present

## 2023-06-29 DIAGNOSIS — I639 Cerebral infarction, unspecified: Secondary | ICD-10-CM | POA: Diagnosis not present

## 2023-06-29 DIAGNOSIS — E559 Vitamin D deficiency, unspecified: Secondary | ICD-10-CM | POA: Diagnosis not present

## 2023-06-29 DIAGNOSIS — N184 Chronic kidney disease, stage 4 (severe): Secondary | ICD-10-CM | POA: Diagnosis not present

## 2023-07-11 DIAGNOSIS — Z952 Presence of prosthetic heart valve: Secondary | ICD-10-CM | POA: Diagnosis not present

## 2023-07-11 DIAGNOSIS — I4821 Permanent atrial fibrillation: Secondary | ICD-10-CM | POA: Diagnosis not present

## 2023-07-11 DIAGNOSIS — Z7901 Long term (current) use of anticoagulants: Secondary | ICD-10-CM | POA: Diagnosis not present

## 2023-08-18 DIAGNOSIS — I4821 Permanent atrial fibrillation: Secondary | ICD-10-CM | POA: Diagnosis not present

## 2023-08-18 DIAGNOSIS — Z7901 Long term (current) use of anticoagulants: Secondary | ICD-10-CM | POA: Diagnosis not present

## 2023-08-18 DIAGNOSIS — Z952 Presence of prosthetic heart valve: Secondary | ICD-10-CM | POA: Diagnosis not present

## 2023-09-28 DIAGNOSIS — Z952 Presence of prosthetic heart valve: Secondary | ICD-10-CM | POA: Diagnosis not present

## 2023-09-28 DIAGNOSIS — I4821 Permanent atrial fibrillation: Secondary | ICD-10-CM | POA: Diagnosis not present

## 2023-09-28 DIAGNOSIS — Z7901 Long term (current) use of anticoagulants: Secondary | ICD-10-CM | POA: Diagnosis not present

## 2023-11-09 DIAGNOSIS — I4821 Permanent atrial fibrillation: Secondary | ICD-10-CM | POA: Diagnosis not present

## 2023-11-09 DIAGNOSIS — Z7901 Long term (current) use of anticoagulants: Secondary | ICD-10-CM | POA: Diagnosis not present

## 2023-11-09 DIAGNOSIS — Z952 Presence of prosthetic heart valve: Secondary | ICD-10-CM | POA: Diagnosis not present

## 2023-12-14 DIAGNOSIS — I4821 Permanent atrial fibrillation: Secondary | ICD-10-CM | POA: Diagnosis not present

## 2023-12-14 DIAGNOSIS — Z952 Presence of prosthetic heart valve: Secondary | ICD-10-CM | POA: Diagnosis not present

## 2023-12-14 DIAGNOSIS — Z7901 Long term (current) use of anticoagulants: Secondary | ICD-10-CM | POA: Diagnosis not present

## 2024-01-25 DIAGNOSIS — I4821 Permanent atrial fibrillation: Secondary | ICD-10-CM | POA: Diagnosis not present

## 2024-01-25 DIAGNOSIS — Z7901 Long term (current) use of anticoagulants: Secondary | ICD-10-CM | POA: Diagnosis not present

## 2024-01-25 DIAGNOSIS — Z952 Presence of prosthetic heart valve: Secondary | ICD-10-CM | POA: Diagnosis not present

## 2024-02-16 DIAGNOSIS — N184 Chronic kidney disease, stage 4 (severe): Secondary | ICD-10-CM | POA: Diagnosis not present

## 2024-02-22 DIAGNOSIS — I482 Chronic atrial fibrillation, unspecified: Secondary | ICD-10-CM | POA: Diagnosis not present

## 2024-02-22 DIAGNOSIS — I639 Cerebral infarction, unspecified: Secondary | ICD-10-CM | POA: Diagnosis not present

## 2024-02-22 DIAGNOSIS — E785 Hyperlipidemia, unspecified: Secondary | ICD-10-CM | POA: Diagnosis not present

## 2024-02-22 DIAGNOSIS — I129 Hypertensive chronic kidney disease with stage 1 through stage 4 chronic kidney disease, or unspecified chronic kidney disease: Secondary | ICD-10-CM | POA: Diagnosis not present

## 2024-02-22 DIAGNOSIS — N184 Chronic kidney disease, stage 4 (severe): Secondary | ICD-10-CM | POA: Diagnosis not present

## 2024-02-27 ENCOUNTER — Other Ambulatory Visit: Payer: Self-pay | Admitting: Physician Assistant

## 2024-02-27 DIAGNOSIS — I10 Essential (primary) hypertension: Secondary | ICD-10-CM

## 2024-02-29 ENCOUNTER — Ambulatory Visit (HOSPITAL_COMMUNITY): Payer: Medicare PPO | Attending: Cardiovascular Disease

## 2024-02-29 ENCOUNTER — Encounter (HOSPITAL_COMMUNITY): Payer: Self-pay | Admitting: Physician Assistant

## 2024-02-29 DIAGNOSIS — H40013 Open angle with borderline findings, low risk, bilateral: Secondary | ICD-10-CM | POA: Diagnosis not present

## 2024-02-29 DIAGNOSIS — H35363 Drusen (degenerative) of macula, bilateral: Secondary | ICD-10-CM | POA: Diagnosis not present

## 2024-02-29 DIAGNOSIS — H524 Presbyopia: Secondary | ICD-10-CM | POA: Diagnosis not present

## 2024-02-29 DIAGNOSIS — H04123 Dry eye syndrome of bilateral lacrimal glands: Secondary | ICD-10-CM | POA: Diagnosis not present

## 2024-02-29 DIAGNOSIS — H43822 Vitreomacular adhesion, left eye: Secondary | ICD-10-CM | POA: Diagnosis not present

## 2024-03-07 DIAGNOSIS — Z7901 Long term (current) use of anticoagulants: Secondary | ICD-10-CM | POA: Diagnosis not present

## 2024-03-07 DIAGNOSIS — Z952 Presence of prosthetic heart valve: Secondary | ICD-10-CM | POA: Diagnosis not present

## 2024-03-07 DIAGNOSIS — I4821 Permanent atrial fibrillation: Secondary | ICD-10-CM | POA: Diagnosis not present

## 2024-03-14 ENCOUNTER — Other Ambulatory Visit (HOSPITAL_COMMUNITY): Payer: Self-pay

## 2024-03-14 ENCOUNTER — Inpatient Hospital Stay (HOSPITAL_COMMUNITY)

## 2024-03-14 ENCOUNTER — Encounter (HOSPITAL_COMMUNITY): Admission: EM | Disposition: E | Payer: Self-pay | Source: Home / Self Care | Attending: Neurology

## 2024-03-14 ENCOUNTER — Other Ambulatory Visit: Payer: Self-pay

## 2024-03-14 ENCOUNTER — Emergency Department (HOSPITAL_COMMUNITY): Admitting: Anesthesiology

## 2024-03-14 ENCOUNTER — Inpatient Hospital Stay (HOSPITAL_COMMUNITY)
Admission: EM | Admit: 2024-03-14 | Discharge: 2024-04-14 | DRG: 023 | Disposition: E | Attending: Neurology | Admitting: Neurology

## 2024-03-14 ENCOUNTER — Emergency Department (HOSPITAL_COMMUNITY)

## 2024-03-14 ENCOUNTER — Encounter (HOSPITAL_COMMUNITY): Payer: Self-pay

## 2024-03-14 DIAGNOSIS — N179 Acute kidney failure, unspecified: Secondary | ICD-10-CM | POA: Diagnosis not present

## 2024-03-14 DIAGNOSIS — Z952 Presence of prosthetic heart valve: Secondary | ICD-10-CM | POA: Diagnosis not present

## 2024-03-14 DIAGNOSIS — R233 Spontaneous ecchymoses: Secondary | ICD-10-CM | POA: Diagnosis not present

## 2024-03-14 DIAGNOSIS — D696 Thrombocytopenia, unspecified: Secondary | ICD-10-CM | POA: Diagnosis present

## 2024-03-14 DIAGNOSIS — J9600 Acute respiratory failure, unspecified whether with hypoxia or hypercapnia: Secondary | ICD-10-CM

## 2024-03-14 DIAGNOSIS — I6389 Other cerebral infarction: Secondary | ICD-10-CM

## 2024-03-14 DIAGNOSIS — N1832 Chronic kidney disease, stage 3b: Secondary | ICD-10-CM | POA: Diagnosis present

## 2024-03-14 DIAGNOSIS — N170 Acute kidney failure with tubular necrosis: Secondary | ICD-10-CM | POA: Diagnosis present

## 2024-03-14 DIAGNOSIS — E870 Hyperosmolality and hypernatremia: Secondary | ICD-10-CM | POA: Diagnosis present

## 2024-03-14 DIAGNOSIS — I6381 Other cerebral infarction due to occlusion or stenosis of small artery: Secondary | ICD-10-CM | POA: Diagnosis not present

## 2024-03-14 DIAGNOSIS — Z7982 Long term (current) use of aspirin: Secondary | ICD-10-CM | POA: Diagnosis not present

## 2024-03-14 DIAGNOSIS — I69391 Dysphagia following cerebral infarction: Secondary | ICD-10-CM | POA: Diagnosis not present

## 2024-03-14 DIAGNOSIS — I10 Essential (primary) hypertension: Secondary | ICD-10-CM

## 2024-03-14 DIAGNOSIS — R29733 NIHSS score 33: Secondary | ICD-10-CM

## 2024-03-14 DIAGNOSIS — R569 Unspecified convulsions: Secondary | ICD-10-CM | POA: Diagnosis present

## 2024-03-14 DIAGNOSIS — R29738 NIHSS score 38: Secondary | ICD-10-CM | POA: Diagnosis present

## 2024-03-14 DIAGNOSIS — R930 Abnormal findings on diagnostic imaging of skull and head, not elsewhere classified: Secondary | ICD-10-CM | POA: Diagnosis not present

## 2024-03-14 DIAGNOSIS — I2510 Atherosclerotic heart disease of native coronary artery without angina pectoris: Secondary | ICD-10-CM | POA: Diagnosis present

## 2024-03-14 DIAGNOSIS — G936 Cerebral edema: Secondary | ICD-10-CM | POA: Diagnosis not present

## 2024-03-14 DIAGNOSIS — R4701 Aphasia: Secondary | ICD-10-CM | POA: Diagnosis present

## 2024-03-14 DIAGNOSIS — Z681 Body mass index (BMI) 19 or less, adult: Secondary | ICD-10-CM

## 2024-03-14 DIAGNOSIS — Z87891 Personal history of nicotine dependence: Secondary | ICD-10-CM | POA: Diagnosis not present

## 2024-03-14 DIAGNOSIS — I639 Cerebral infarction, unspecified: Secondary | ICD-10-CM

## 2024-03-14 DIAGNOSIS — I63512 Cerebral infarction due to unspecified occlusion or stenosis of left middle cerebral artery: Secondary | ICD-10-CM | POA: Diagnosis present

## 2024-03-14 DIAGNOSIS — I6522 Occlusion and stenosis of left carotid artery: Secondary | ICD-10-CM | POA: Diagnosis not present

## 2024-03-14 DIAGNOSIS — I63232 Cerebral infarction due to unspecified occlusion or stenosis of left carotid arteries: Secondary | ICD-10-CM | POA: Diagnosis not present

## 2024-03-14 DIAGNOSIS — Z515 Encounter for palliative care: Secondary | ICD-10-CM

## 2024-03-14 DIAGNOSIS — T45516A Underdosing of anticoagulants, initial encounter: Secondary | ICD-10-CM | POA: Diagnosis not present

## 2024-03-14 DIAGNOSIS — J9601 Acute respiratory failure with hypoxia: Secondary | ICD-10-CM

## 2024-03-14 DIAGNOSIS — I615 Nontraumatic intracerebral hemorrhage, intraventricular: Secondary | ICD-10-CM | POA: Diagnosis not present

## 2024-03-14 DIAGNOSIS — Z8673 Personal history of transient ischemic attack (TIA), and cerebral infarction without residual deficits: Secondary | ICD-10-CM | POA: Diagnosis not present

## 2024-03-14 DIAGNOSIS — E440 Moderate protein-calorie malnutrition: Secondary | ICD-10-CM | POA: Diagnosis present

## 2024-03-14 DIAGNOSIS — I6529 Occlusion and stenosis of unspecified carotid artery: Secondary | ICD-10-CM | POA: Diagnosis not present

## 2024-03-14 DIAGNOSIS — I63032 Cerebral infarction due to thrombosis of left carotid artery: Secondary | ICD-10-CM | POA: Diagnosis not present

## 2024-03-14 DIAGNOSIS — G9349 Other encephalopathy: Secondary | ICD-10-CM | POA: Diagnosis present

## 2024-03-14 DIAGNOSIS — Z66 Do not resuscitate: Secondary | ICD-10-CM | POA: Diagnosis not present

## 2024-03-14 DIAGNOSIS — M5030 Other cervical disc degeneration, unspecified cervical region: Secondary | ICD-10-CM | POA: Diagnosis not present

## 2024-03-14 DIAGNOSIS — I739 Peripheral vascular disease, unspecified: Secondary | ICD-10-CM

## 2024-03-14 DIAGNOSIS — F0390 Unspecified dementia without behavioral disturbance: Secondary | ICD-10-CM | POA: Diagnosis present

## 2024-03-14 DIAGNOSIS — I959 Hypotension, unspecified: Secondary | ICD-10-CM | POA: Diagnosis not present

## 2024-03-14 DIAGNOSIS — I4891 Unspecified atrial fibrillation: Secondary | ICD-10-CM

## 2024-03-14 DIAGNOSIS — I609 Nontraumatic subarachnoid hemorrhage, unspecified: Secondary | ICD-10-CM | POA: Diagnosis not present

## 2024-03-14 DIAGNOSIS — R1310 Dysphagia, unspecified: Secondary | ICD-10-CM | POA: Diagnosis present

## 2024-03-14 DIAGNOSIS — I6782 Cerebral ischemia: Secondary | ICD-10-CM | POA: Diagnosis not present

## 2024-03-14 DIAGNOSIS — R29727 NIHSS score 27: Secondary | ICD-10-CM | POA: Diagnosis not present

## 2024-03-14 DIAGNOSIS — R29818 Other symptoms and signs involving the nervous system: Secondary | ICD-10-CM | POA: Diagnosis not present

## 2024-03-14 DIAGNOSIS — R791 Abnormal coagulation profile: Secondary | ICD-10-CM | POA: Diagnosis not present

## 2024-03-14 DIAGNOSIS — R29723 NIHSS score 23: Secondary | ICD-10-CM | POA: Diagnosis not present

## 2024-03-14 DIAGNOSIS — I129 Hypertensive chronic kidney disease with stage 1 through stage 4 chronic kidney disease, or unspecified chronic kidney disease: Secondary | ICD-10-CM | POA: Diagnosis present

## 2024-03-14 DIAGNOSIS — Z7901 Long term (current) use of anticoagulants: Secondary | ICD-10-CM | POA: Diagnosis not present

## 2024-03-14 DIAGNOSIS — I480 Paroxysmal atrial fibrillation: Secondary | ICD-10-CM | POA: Diagnosis not present

## 2024-03-14 DIAGNOSIS — I63412 Cerebral infarction due to embolism of left middle cerebral artery: Secondary | ICD-10-CM | POA: Diagnosis not present

## 2024-03-14 DIAGNOSIS — I517 Cardiomegaly: Secondary | ICD-10-CM | POA: Diagnosis not present

## 2024-03-14 DIAGNOSIS — Z4682 Encounter for fitting and adjustment of non-vascular catheter: Secondary | ICD-10-CM | POA: Diagnosis not present

## 2024-03-14 DIAGNOSIS — I6502 Occlusion and stenosis of left vertebral artery: Secondary | ICD-10-CM | POA: Diagnosis not present

## 2024-03-14 DIAGNOSIS — I361 Nonrheumatic tricuspid (valve) insufficiency: Secondary | ICD-10-CM | POA: Diagnosis not present

## 2024-03-14 DIAGNOSIS — I610 Nontraumatic intracerebral hemorrhage in hemisphere, subcortical: Secondary | ICD-10-CM | POA: Diagnosis not present

## 2024-03-14 DIAGNOSIS — R4020 Unspecified coma: Secondary | ICD-10-CM | POA: Diagnosis not present

## 2024-03-14 HISTORY — PX: IR PERCUTANEOUS ART THROMBECTOMY/INFUSION INTRACRANIAL INC DIAG ANGIO: IMG6087

## 2024-03-14 HISTORY — DX: Cerebral infarction, unspecified: I63.9

## 2024-03-14 HISTORY — PX: IR CT HEAD LTD: IMG2386

## 2024-03-14 HISTORY — DX: Atherosclerotic heart disease of native coronary artery without angina pectoris: I25.10

## 2024-03-14 HISTORY — DX: Essential (primary) hypertension: I10

## 2024-03-14 HISTORY — PX: IR ANGIO VERTEBRAL SEL SUBCLAVIAN INNOMINATE UNI L MOD SED: IMG5364

## 2024-03-14 HISTORY — PX: IR ANGIO VERTEBRAL SEL VERTEBRAL UNI R MOD SED: IMG5368

## 2024-03-14 HISTORY — PX: RADIOLOGY WITH ANESTHESIA: SHX6223

## 2024-03-14 LAB — GLUCOSE, CAPILLARY
Glucose-Capillary: 112 mg/dL — ABNORMAL HIGH (ref 70–99)
Glucose-Capillary: 99 mg/dL (ref 70–99)
Glucose-Capillary: 105 mg/dL — ABNORMAL HIGH (ref 70–99)

## 2024-03-14 LAB — I-STAT CHEM 8, ED
BUN: 36 mg/dL — ABNORMAL HIGH (ref 8–23)
Calcium, Ion: 1.15 mmol/L (ref 1.15–1.40)
Chloride: 105 mmol/L (ref 98–111)
Creatinine, Ser: 2.3 mg/dL — ABNORMAL HIGH (ref 0.44–1.00)
Glucose, Bld: 170 mg/dL — ABNORMAL HIGH (ref 70–99)
HCT: 32 % — ABNORMAL LOW (ref 36.0–46.0)
Hemoglobin: 10.9 g/dL — ABNORMAL LOW (ref 12.0–15.0)
Potassium: 4.4 mmol/L (ref 3.5–5.1)
Sodium: 139 mmol/L (ref 135–145)
TCO2: 23 mmol/L (ref 22–32)

## 2024-03-14 LAB — CBC WITH DIFFERENTIAL/PLATELET
Abs Immature Granulocytes: 0.03 K/uL (ref 0.00–0.07)
Basophils Absolute: 0 K/uL (ref 0.0–0.1)
Basophils Relative: 0 %
Eosinophils Absolute: 0 K/uL (ref 0.0–0.5)
Eosinophils Relative: 0 %
HCT: 31.9 % — ABNORMAL LOW (ref 36.0–46.0)
Hemoglobin: 10 g/dL — ABNORMAL LOW (ref 12.0–15.0)
Immature Granulocytes: 0 %
Lymphocytes Relative: 15 %
Lymphs Abs: 1.1 K/uL (ref 0.7–4.0)
MCH: 33.4 pg (ref 26.0–34.0)
MCHC: 31.3 g/dL (ref 30.0–36.0)
MCV: 106.7 fL — ABNORMAL HIGH (ref 80.0–100.0)
Monocytes Absolute: 0.6 K/uL (ref 0.1–1.0)
Monocytes Relative: 8 %
Neutro Abs: 6 K/uL (ref 1.7–7.7)
Neutrophils Relative %: 77 %
Platelets: 167 K/uL (ref 150–400)
RBC: 2.99 MIL/uL — ABNORMAL LOW (ref 3.87–5.11)
RDW: 11.9 % (ref 11.5–15.5)
WBC: 7.8 K/uL (ref 4.0–10.5)
nRBC: 0 % (ref 0.0–0.2)

## 2024-03-14 LAB — I-STAT ARTERIAL BLOOD GAS, ED
Acid-base deficit: 2 mmol/L (ref 0.0–2.0)
Bicarbonate: 23 mmol/L (ref 20.0–28.0)
Calcium, Ion: 1.16 mmol/L (ref 1.15–1.40)
HCT: 28 % — ABNORMAL LOW (ref 36.0–46.0)
Hemoglobin: 9.5 g/dL — ABNORMAL LOW (ref 12.0–15.0)
O2 Saturation: 100 %
Patient temperature: 96
Potassium: 3.8 mmol/L (ref 3.5–5.1)
Sodium: 139 mmol/L (ref 135–145)
TCO2: 24 mmol/L (ref 22–32)
pCO2 arterial: 34.5 mmHg (ref 32–48)
pH, Arterial: 7.425 (ref 7.35–7.45)
pO2, Arterial: 505 mmHg — ABNORMAL HIGH (ref 83–108)

## 2024-03-14 LAB — ETHANOL: Alcohol, Ethyl (B): <15 mg/dL (ref <15)

## 2024-03-14 LAB — COMPREHENSIVE METABOLIC PANEL WITH GFR
ALT: 18 U/L (ref 0–44)
AST: 31 U/L (ref 15–41)
Albumin: 4.2 g/dL (ref 3.5–5.0)
Alkaline Phosphatase: 68 U/L (ref 38–126)
Anion gap: 13 (ref 5–15)
BUN: 37 mg/dL — ABNORMAL HIGH (ref 8–23)
CO2: 23 mmol/L (ref 22–32)
Calcium: 9.2 mg/dL (ref 8.9–10.3)
Chloride: 101 mmol/L (ref 98–111)
Creatinine, Ser: 2.21 mg/dL — ABNORMAL HIGH (ref 0.44–1.00)
GFR, Estimated: 21 mL/min — ABNORMAL LOW (ref >60)
Glucose, Bld: 164 mg/dL — ABNORMAL HIGH (ref 70–99)
Potassium: 4.2 mmol/L (ref 3.5–5.1)
Sodium: 137 mmol/L (ref 135–145)
Total Bilirubin: 0.9 mg/dL (ref 0.0–1.2)
Total Protein: 7.2 g/dL (ref 6.5–8.1)

## 2024-03-14 LAB — URINALYSIS, ROUTINE W REFLEX MICROSCOPIC
Bilirubin Urine: NEGATIVE
Glucose, UA: NEGATIVE mg/dL
Hgb urine dipstick: NEGATIVE
Ketones, ur: NEGATIVE mg/dL
Leukocytes,Ua: NEGATIVE
Nitrite: NEGATIVE
Protein, ur: NEGATIVE mg/dL
Specific Gravity, Urine: 1.011 (ref 1.005–1.030)
pH: 5 (ref 5.0–8.0)

## 2024-03-14 LAB — I-STAT VENOUS BLOOD GAS, ED
Acid-base deficit: 1 mmol/L (ref 0.0–2.0)
Bicarbonate: 24.4 mmol/L (ref 20.0–28.0)
Calcium, Ion: 1.12 mmol/L — ABNORMAL LOW (ref 1.15–1.40)
HCT: 32 % — ABNORMAL LOW (ref 36.0–46.0)
Hemoglobin: 10.9 g/dL — ABNORMAL LOW (ref 12.0–15.0)
O2 Saturation: 99 %
Potassium: 4.5 mmol/L (ref 3.5–5.1)
Sodium: 138 mmol/L (ref 135–145)
TCO2: 26 mmol/L (ref 22–32)
pCO2, Ven: 42.5 mmHg — ABNORMAL LOW (ref 44–60)
pH, Ven: 7.368 (ref 7.25–7.43)
pO2, Ven: 120 mmHg — ABNORMAL HIGH (ref 32–45)

## 2024-03-14 LAB — BRAIN NATRIURETIC PEPTIDE: B Natriuretic Peptide: 93.3 pg/mL (ref 0.0–100.0)

## 2024-03-14 LAB — ECHOCARDIOGRAM COMPLETE
Area-P 1/2: 3.1 cm2
Height: 65 in
MV VTI: 1.77 cm2
S' Lateral: 2 cm
Weight: 1840 [oz_av]

## 2024-03-14 LAB — HEMOGLOBIN A1C
Hgb A1c MFr Bld: 5.2 % (ref 4.8–5.6)
Mean Plasma Glucose: 102.54 mg/dL

## 2024-03-14 LAB — SODIUM
Sodium: 139 mmol/L (ref 135–145)
Sodium: 141 mmol/L (ref 135–145)

## 2024-03-14 LAB — LACTIC ACID, PLASMA
Lactic Acid, Venous: 2.2 mmol/L (ref 0.5–1.9)
Lactic Acid, Venous: 2.9 mmol/L (ref 0.5–1.9)

## 2024-03-14 LAB — PROTIME-INR
INR: 2.3 — ABNORMAL HIGH (ref 0.8–1.2)
Prothrombin Time: 26.1 s — ABNORMAL HIGH (ref 11.4–15.2)

## 2024-03-14 LAB — TROPONIN I (HIGH SENSITIVITY)
Troponin I (High Sensitivity): 16 ng/L (ref <18)
Troponin I (High Sensitivity): 13 ng/L (ref <18)

## 2024-03-14 LAB — I-STAT CG4 LACTIC ACID, ED: Lactic Acid, Venous: 2.2 mmol/L (ref 0.5–1.9)

## 2024-03-14 LAB — CK: Total CK: 122 U/L (ref 38–234)

## 2024-03-14 LAB — CBG MONITORING, ED: Glucose-Capillary: 168 mg/dL — ABNORMAL HIGH (ref 70–99)

## 2024-03-14 LAB — MRSA NEXT GEN BY PCR, NASAL: MRSA by PCR Next Gen: NOT DETECTED

## 2024-03-14 SURGERY — RADIOLOGY WITH ANESTHESIA
Anesthesia: General

## 2024-03-14 MED ORDER — POLYETHYLENE GLYCOL 3350 17 G PO PACK
17.0000 g | PACK | Freq: Every day | ORAL | Status: DC
  Administered 2024-03-15 – 2024-03-19 (×5): 17 g
  Filled 2024-03-14 (×5): qty 1

## 2024-03-14 MED ORDER — NOREPINEPHRINE 4 MG/250ML-% IV SOLN
0.0000 ug/min | INTRAVENOUS | Status: DC
  Administered 2024-03-14: 2 ug/min via INTRAVENOUS
  Administered 2024-03-15: 6 ug/min via INTRAVENOUS
  Filled 2024-03-14 (×2): qty 250

## 2024-03-14 MED ORDER — FENTANYL CITRATE PF 50 MCG/ML IJ SOSY
50.0000 ug | PREFILLED_SYRINGE | INTRAMUSCULAR | Status: DC | PRN
  Administered 2024-03-15: 50 ug via INTRAVENOUS

## 2024-03-14 MED ORDER — MIDAZOLAM HCL 2 MG/2ML IJ SOLN
INTRAMUSCULAR | Status: AC
  Filled 2024-03-14: qty 6

## 2024-03-14 MED ORDER — CHLORHEXIDINE GLUCONATE CLOTH 2 % EX PADS
6.0000 | MEDICATED_PAD | Freq: Every day | CUTANEOUS | Status: DC
  Administered 2024-03-15 – 2024-03-21 (×7): 6 via TOPICAL

## 2024-03-14 MED ORDER — LACTATED RINGERS IV SOLN: INTRAVENOUS | Status: DC

## 2024-03-14 MED ORDER — LEVETIRACETAM (KEPPRA) 500 MG/5 ML ADULT IV PUSH
2000.0000 mg | Freq: Once | INTRAVENOUS | Status: AC
  Administered 2024-03-14: 2000 mg via INTRAVENOUS
  Filled 2024-03-14: qty 20

## 2024-03-14 MED ORDER — FAMOTIDINE 20 MG PO TABS
20.0000 mg | ORAL_TABLET | Freq: Every day | ORAL | Status: DC
Start: 2024-03-14 — End: 2024-03-15

## 2024-03-14 MED ORDER — ETOMIDATE 2 MG/ML IV SOLN
INTRAVENOUS | Status: AC | PRN
  Administered 2024-03-14: 10 mg via INTRAVENOUS

## 2024-03-14 MED ORDER — PHENYLEPHRINE 80 MCG/ML (10ML) SYRINGE FOR IV PUSH (FOR BLOOD PRESSURE SUPPORT)
PREFILLED_SYRINGE | INTRAVENOUS | Status: DC | PRN
  Administered 2024-03-14 (×3): 80 ug via INTRAVENOUS

## 2024-03-14 MED ORDER — ACETAMINOPHEN 325 MG PO TABS: 650.0000 mg | ORAL_TABLET | ORAL | Status: DC | PRN

## 2024-03-14 MED ORDER — DOCUSATE SODIUM 50 MG/5ML PO LIQD
100.0000 mg | Freq: Two times a day (BID) | ORAL | Status: DC
  Administered 2024-03-15 – 2024-03-21 (×12): 100 mg
  Filled 2024-03-14 (×13): qty 10

## 2024-03-14 MED ORDER — STROKE: EARLY STAGES OF RECOVERY BOOK
Freq: Once | Status: AC
  Filled 2024-03-14: qty 1

## 2024-03-14 MED ORDER — PROPOFOL 1000 MG/100ML IV EMUL
0.0000 ug/kg/min | INTRAVENOUS | Status: DC
  Administered 2024-03-14: 20 ug/kg/min via INTRAVENOUS
  Administered 2024-03-14 – 2024-03-15 (×2): 30 ug/kg/min via INTRAVENOUS
  Filled 2024-03-14 (×2): qty 100

## 2024-03-14 MED ORDER — ACETAMINOPHEN 160 MG/5ML PO SOLN
650.0000 mg | ORAL | Status: DC | PRN
  Administered 2024-03-16 – 2024-03-21 (×9): 650 mg
  Filled 2024-03-14 (×9): qty 20.3

## 2024-03-14 MED ORDER — CLEVIDIPINE BUTYRATE 0.5 MG/ML IV EMUL: 0.0000 mg/h | INTRAVENOUS | Status: DC

## 2024-03-14 MED ORDER — SENNOSIDES-DOCUSATE SODIUM 8.6-50 MG PO TABS: 1.0000 | ORAL_TABLET | Freq: Every evening | ORAL | Status: DC | PRN

## 2024-03-14 MED ORDER — SODIUM CHLORIDE 0.9 % IV SOLN: INTRAVENOUS | Status: DC

## 2024-03-14 MED ORDER — SODIUM CHLORIDE 3 % IV SOLN
INTRAVENOUS | Status: DC
  Filled 2024-03-14 (×5): qty 500

## 2024-03-14 MED ORDER — LEVETIRACETAM (KEPPRA) 500 MG/5 ML ADULT IV PUSH
500.0000 mg | Freq: Two times a day (BID) | INTRAVENOUS | Status: DC
  Administered 2024-03-15 – 2024-03-16 (×4): 500 mg via INTRAVENOUS
  Filled 2024-03-14 (×4): qty 5

## 2024-03-14 MED ORDER — LACTATED RINGERS IV SOLN: INTRAVENOUS | Status: DC | PRN

## 2024-03-14 MED ORDER — SODIUM CHLORIDE 0.9 % IV BOLUS: 250.0000 mL | INTRAVENOUS | Status: AC | PRN

## 2024-03-14 MED ORDER — ACETAMINOPHEN 650 MG RE SUPP: 650.0000 mg | RECTAL | Status: DC | PRN

## 2024-03-14 MED ORDER — SODIUM CHLORIDE 0.9 % IV SOLN: 250.0000 mL | INTRAVENOUS | Status: AC

## 2024-03-14 MED ORDER — IOHEXOL 300 MG/ML  SOLN
150.0000 mL | Freq: Once | INTRAMUSCULAR | Status: AC | PRN
  Administered 2024-03-14: 65 mL via INTRA_ARTERIAL

## 2024-03-14 MED ORDER — ROCURONIUM BROMIDE 10 MG/ML (PF) SYRINGE
PREFILLED_SYRINGE | INTRAVENOUS | Status: DC | PRN
  Administered 2024-03-14 (×2): 20 mg via INTRAVENOUS

## 2024-03-14 MED ORDER — FENTANYL CITRATE PF 50 MCG/ML IJ SOSY
50.0000 ug | PREFILLED_SYRINGE | INTRAMUSCULAR | Status: DC | PRN
  Administered 2024-03-15 (×2): 50 ug via INTRAVENOUS
  Administered 2024-03-16: 150 ug via INTRAVENOUS
  Administered 2024-03-16 – 2024-03-21 (×4): 100 ug via INTRAVENOUS
  Filled 2024-03-14: qty 1
  Filled 2024-03-14 (×2): qty 2
  Filled 2024-03-14: qty 3
  Filled 2024-03-14: qty 1
  Filled 2024-03-14 (×2): qty 2
  Filled 2024-03-14: qty 1

## 2024-03-14 MED ORDER — EPHEDRINE SULFATE-NACL 50-0.9 MG/10ML-% IV SOSY
PREFILLED_SYRINGE | INTRAVENOUS | Status: DC | PRN
  Administered 2024-03-14 (×3): 5 mg via INTRAVENOUS

## 2024-03-14 MED ORDER — IOHEXOL 350 MG/ML SOLN
100.0000 mL | Freq: Once | INTRAVENOUS | Status: AC | PRN
  Administered 2024-03-14: 100 mL via INTRAVENOUS

## 2024-03-14 MED ORDER — ROCURONIUM BROMIDE 10 MG/ML (PF) SYRINGE
PREFILLED_SYRINGE | INTRAVENOUS | Status: AC | PRN
  Administered 2024-03-14: 80 mg via INTRAVENOUS

## 2024-03-14 MED ADMIN — Midazolam HCl Inj 2 MG/2ML (Base Equivalent): 5 mg | INTRAVENOUS | NDC 63323041112

## 2024-03-14 NOTE — Code Documentation (Signed)
 Stroke Response Nurse Documentation Code Documentation  Miranda Parsons is a 89 y.o. female arriving to [facility]  via [facility] on 03/14/24 with past medical hx of unknown. On unknown use of antithrombotics. Code stroke was activated by ED.   Patient from home where she was LKW at 2200 last night and now complaining of unresponsive with left gaze.  Stroke team at the bedside on patient arrival. Labs drawn and patient cleared for CT by EDP. Patient to CT with team. NIHSS 38, see documentation for details and code stroke times. Patient with unresponsive on exam. The following imaging was completed:  CT Head, CTA, and CTP. Patient is not a candidate for IV Thrombolytic due to OOW. Patient is a candidate for IR due to LVO.   Care Plan:    IR: VS & NIHSS: Q60min x 1 hour, Q58min x 1 hour, then Q1h x 22 hrs Vascular checks and distal pulse (s/p sheath d/c) : q15min x4, q30min x2, q1h x4, then q2h .     Bedside handoff with IR RN Augustin.    Miranda Parsons  Stroke Response RN

## 2024-03-14 NOTE — Procedures (Signed)
 Patient Name: Miranda Parsons  MRN: [id number]  Epilepsy Attending: Arlin MALVA Krebs  Referring Physician/Provider: Armenta Canning, MD  Date: 03/14/2024 Duration: 22.55 mins  Patient history:  89 y.o. female with history of CAD, HTN, mechanical heart valve on Coumadin and stroke who presented after being found unresponsive at home by her husband. EEG to evaluate for seizure  Level of alertness:  comatose  AEDs during EEG study: LEV, propofol  Technical aspects: This EEG study was done with scalp electrodes positioned according to the 10-20 International system of electrode placement. Electrical activity was reviewed with band pass filter of 1-70Hz , sensitivity of 7 uV/mm, display speed of 49mm/sec with a 60Hz  notched filter applied as appropriate. EEG data were recorded continuously and digitally stored.  Video monitoring was available and reviewed as appropriate.  Description: EEG showed continuous generalized 3 to 6 Hz theta-delta slowing admixed with 13-14hz  beta activity distributed symmetrically and diffusely.  Hyperventilation and photic stimulation were not performed.     ABNORMALITY - Continuous slow, generalized  IMPRESSION: This study is suggestive of moderate to severe diffuse encephalopathy. No seizures or epileptiform discharges were seen throughout the recording.  Braydn Carneiro O Lolly Glaus

## 2024-03-14 NOTE — Procedures (Signed)
  NEUROSURGERY BRIEF THROMBECTOMY NOTE   PREOP DX: Acute ischemic stroke  POSTOP DX: Same  PROCEDURE: Left ICA thrombectomy  SURGEON: Daneille Desilva   ANESTHESIA: GETA  EBL: Minimal  Number of Passes: 3  Technique: ASPIRATION  Final TICI score: 3  Post OP blood pressure goal: SBP<160  Arterial Angioplasty or Stent: No   Anti-Platelet Therapy: No   COMPLICATIONS: No   CONDITION: Stable to recovery  FINDINGS (Full report in [facility]): Successful left ICA thrombectomy Left vertebral artery originates for ECA and is occluded Right vertebral artery is patent Contrast staining noted on initial postop CT. Will plan to follow by CT in short interval.   Miranda Parsons  @today @ 1:02 PM

## 2024-03-14 NOTE — ED Notes (Signed)
 Pt noted to be seizing, eye gaze to left, purposeful movement to pain.

## 2024-03-14 NOTE — ED Notes (Signed)
 Pt to IR w/MD, RN, and RT.

## 2024-03-14 NOTE — Progress Notes (Signed)
 Patient was transported from the PACU to 4N26 without any complications.

## 2024-03-14 NOTE — ED Notes (Signed)
 Pt to CT scan via stretcher w/RN and RT.

## 2024-03-14 NOTE — Progress Notes (Signed)
 Spoke with RN via secure chat pt is having CT and then we can proceed with EEG.

## 2024-03-14 NOTE — Progress Notes (Signed)
 Visited with pt.  Family at bedside.  Family spoke with doctor for update, Chaplain provided emotional and spiritual support/  Chaplain available as needed.  Rayleen Dade, [place], [facility], Pager [phone]

## 2024-03-14 NOTE — Anesthesia Procedure Notes (Signed)
 Procedure Name: Intubation Date/Time: 03/14/2024 12:17 PM  Performed by: Emmitt Millman, CRNAOxygen Delivery Method: Circle system utilized Induction Type: Inhalational induction with existing ETT

## 2024-03-14 NOTE — ED Notes (Signed)
 Xray called

## 2024-03-14 NOTE — Consult Note (Signed)
 NAME:  Miranda Parsons, MRN:  [id number], DOB:  02-22-1936, LOS: 0 ADMISSION DATE:  03/14/2024, CONSULTATION DATE:  10/1 REFERRING MD:  Dr. Ray, CHIEF COMPLAINT:  L ICA stroke s/p thrombectomy   History of Present Illness:  Patient is a 89 yo F w/ pertinent PMH CAD, HTN, mechanical heart valve on coumadin, dementia, previous stroke brought to [facility] ED on 10/1 code stroke.  On 10/1 patient found down by husband in am, unresponsive on floor. LKN last night 9-10 pm on 9/30. EMS called. Unresponsive w/ left gaze deviation. Bp 150/70 and cbg 140. Patient bagged en route to [facility] ED. On arrival patient with possible seizure w/ left gaze deviation. Given versed and loaded w/ keppra. Intubated for airway protection. CT head showing hyperdense L ICA. CTA head L ICA occlusion. Patient taken to IR for thrombectomy post TICI 3 revascularization. Post op intubated/sedated and brought to 4N icu.   Pertinent ED Labs: creat 2.2, LA 2.2  Pertinent  Medical History   Past Medical History:  Diagnosis Date   CAD (coronary artery disease)    Hypertension    Stroke (HCC)      Significant Hospital Events: Including procedures, antibiotic start and stop dates in addition to other pertinent events   10/1   Interim History / Subjective:  See above  Objective    Blood pressure 135/61, pulse 79, temperature (!) 95.1 F (35.1 C), resp. rate 18, height 5' 5 (1.651 m), weight 52.2 kg, SpO2 100%.    Vent Mode: PRVC FiO2 (%):  [50 %-100 %] 100 % Set Rate:  [18 bmp] 18 bmp Vt Set:  [450 mL] 450 mL PEEP:  [5 cmH20] 5 cmH20 Plateau Pressure:  [14 cmH20-16 cmH20] 16 cmH20   Intake/Output Summary (Last 24 hours) at 03/14/2024 1341 Last data filed at 03/14/2024 1309 Gross per 24 hour  Intake 700 ml  Output 315 ml  Net 385 ml   Filed Weights   03/14/24 1040  Weight: 52.2 kg    Examination: General:  critically ill appearing on mech vent HEENT: MM pink/moist; ETT in place Neuro: sedate CV: s1s2, RRR,  no m/r/g PULM:  dim clear BS bilaterally; on mech vent PRVC GI: soft, bsx4 active  Extremities: warm/dry, no edema  Skin: no rashes or lesions    Resolved problem list   Assessment and Plan   L ICA stroke s/p thrombectomy TICI 3 Possible seizure Hx of dementia and previous stroke Plan: -neuro IR and stroke following; appreciate recs -repeat imaging per stroke and neuro IR -EEG -AEDs per neuro -seizure precautions -frequent neuro checks -limit sedating meds -SBP goal 120-160 per neuro; cont clevi -statin, APT per neuro -when to resume AC per stroke -Continue neuroprotective measures- normothermia, euglycemia, HOB greater than 30, head in neutral alignment, normocapnia, normoxia -echo, lipid panel, A1c -pt/ot/slp  Acute respiratory failure: intubated for airway protection due to poor ams Plan: -LTVV strategy with tidal volumes of 6-8 cc/kg ideal body weight -check ABG and adjust settings accordingly  -Wean PEEP/FiO2 for SpO2 >92% -VAP bundle in place -Daily SAT and SBT when appropriate -PAD protocol in place -wean sedation for RASS goal 0 to -1 -Follow intermittent CXR and ABG PRN  AKI LA: possibly seizure related? Plan: -trend LA and check ck -Trend BMP / urinary output -Replace electrolytes as indicated -Avoid nephrotoxic agents, ensure adequate renal perfusion  Afib CAD HTN MVR w/ mechanical heart valve on coumadin Plan: -APT and when to resume AC per stroke -clevi for sbp  goal as above  Best Practice (right click and Reselect all SmartList Selections daily)   Diet/type: NPO w/ meds via tube DVT prophylaxis: SCD GI prophylaxis: H2B Lines: N/A Foley:  Yes, and it is still needed Code Status:  full code Last date of multidisciplinary goals of care discussion [per primary]   Labs   CBC: Recent Labs  Lab 03/14/24 1028 03/14/24 1038 03/14/24 1039 03/14/24 1126  WBC 7.8  --   --   --   NEUTROABS 6.0  --   --   --   HGB 10.0* 10.9* 10.9* 9.5*   HCT 31.9* 32.0* 32.0* 28.0*  MCV 106.7*  --   --   --   PLT 167  --   --   --     Basic Metabolic Panel: Recent Labs  Lab 03/14/24 1028 03/14/24 1038 03/14/24 1039 03/14/24 1126  NA 137 139 138 139  K 4.2 4.4 4.5 3.8  CL 101 105  --   --   CO2 23  --   --   --   GLUCOSE 164* 170*  --   --   BUN 37* 36*  --   --   CREATININE 2.21* 2.30*  --   --   CALCIUM 9.2  --   --   --    GFR: Estimated Creatinine Clearance: 14.5 mL/min (A) (by C-G formula based on SCr of 2.3 mg/dL (H)). Recent Labs  Lab 03/14/24 1028 03/14/24 1039  WBC 7.8  --   LATICACIDVEN  --  2.2*    Liver Function Tests: Recent Labs  Lab 03/14/24 1028  AST 31  ALT 18  ALKPHOS 68  BILITOT 0.9  PROT 7.2  ALBUMIN 4.2   No results for input(s): LIPASE, AMYLASE in the last 168 hours. No results for input(s): AMMONIA in the last 168 hours.  ABG    Component Value Date/Time   PHART 7.425 03/14/2024 1126   PCO2ART 34.5 03/14/2024 1126   PO2ART 505 (H) 03/14/2024 1126   HCO3 23.0 03/14/2024 1126   TCO2 24 03/14/2024 1126   ACIDBASEDEF 2.0 03/14/2024 1126   O2SAT 100 03/14/2024 1126     Coagulation Profile: Recent Labs  Lab 03/14/24 1028  INR 2.3*    Cardiac Enzymes: No results for input(s): CKTOTAL, CKMB, CKMBINDEX, TROPONINI in the last 168 hours.  HbA1C: No results found for: HGBA1C  CBG: Recent Labs  Lab 03/14/24 1028  GLUCAP 168*    Review of Systems:   Patient is intubated; therefore, history has been obtained from chart review.    Past Medical History:  She,  has a past medical history of CAD (coronary artery disease), Hypertension, and Stroke (HCC).   Surgical History:  History reviewed. No pertinent surgical history.   Social History:   reports that she has quit smoking. Her smoking use included cigarettes. She has never used smokeless tobacco. She reports that she does not drink alcohol and does not use drugs.   Family History:  Her family history is  not on file.   Allergies No Known Allergies   Home Medications  Prior to Admission medications   Not on File     Critical care time: 45 minutes     JD Emilio RIGGERS [facility] 03/14/2024, 1:41 PM  Please see [email or web address] for pager details.  From 7A-7P if no response, please call [phone]. After hours, please call ELink [phone].

## 2024-03-14 NOTE — Progress Notes (Signed)
 Pt transported to CT-Trauma B-CT-Trauma B-IR without complications.

## 2024-03-14 NOTE — ED Notes (Signed)
 EDP notified of hypothermia

## 2024-03-14 NOTE — Anesthesia Preprocedure Evaluation (Signed)
 Anesthesia Evaluation  Patient identified by MRN, date of birth, ID bandPreop documentation limited or incomplete due to emergent nature of procedure.  Airway Mallampati: Intubated  TM Distance: >3 FB Neck ROM: Full    Dental  (+) Dental Advisory Given   Pulmonary former smoker   breath sounds clear to auscultation       Cardiovascular hypertension, + CAD  + dysrhythmias Atrial Fibrillation  Rhythm:Irregular Rate:Bradycardia     Neuro/Psych CVA    GI/Hepatic   Endo/Other    Renal/GU      Musculoskeletal   Abdominal   Peds  Hematology   Anesthesia Other Findings   Reproductive/Obstetrics                              Anesthesia Physical Anesthesia Plan  ASA: 4 and emergent  Anesthesia Plan: General   Post-op Pain Management:    Induction: Inhalational  PONV Risk Score and Plan: 3 and Dexamethasone, Ondansetron and Treatment may vary due to age or medical condition  Airway Management Planned: Oral ETT  Additional Equipment:   Intra-op Plan:   Post-operative Plan: Post-operative intubation/ventilation  Informed Consent: I have reviewed the patients History and Physical, chart, labs and discussed the procedure including the risks, benefits and alternatives for the proposed anesthesia with the patient or authorized representative who has indicated his/her understanding and acceptance.       Plan Discussed with: CRNA  Anesthesia Plan Comments:         Anesthesia Quick Evaluation

## 2024-03-14 NOTE — ED Provider Notes (Addendum)
 [place] EMERGENCY DEPARTMENT AT [facility] Provider Note   CSN: [id number] Arrival date & time: 03/14/24  1028     Patient presents with: unresponsive   Miranda Parsons is a 89 y.o. female.  {Add pertinent medical, surgical, social history, OB history to HPI:32947} HPI At this time we have very limited history.  EMS reports that the patient's husband has cognitive impairment and was not a good historian.  Despite him being home they apparently had to break into enter the home to provide care.  Patient was found on the floor not responsive.  Reportedly the husband last saw her sometime last night.  EMS was called at 09 40.  Patient's jaw was clenched and EMS could not intubate.  Nasal cannula inserted and bag-valve-mask applied.  We have report that the patient is taking Eliquis but extremely limited other additional history at this time.  Reportedly patient was going to a doctor's appointment today.  Unknown what type of appointment or for what.    Prior to Admission medications   Not on File    Allergies: Patient has no allergy information on record.    Review of Systems  Updated Vital Signs BP 100/67   Pulse 75   Resp 18   Ht 5' 5 (1.651 m)   Wt 52.2 kg   SpO2 99%   BMI 19.14 kg/m   Physical Exam Constitutional:      Comments: Patient is poorly responsive but is localizing with movements of the arms and spontaneous but deep slow respirations.  HENT:     Head:     Comments: I do not appreciate any head or facial trauma.  Patient does have some bleeding from the naris where 2 trumpets were inserted but there does not appear to be any acute traumatic injury.    Nose:     Comments: Bilateral nasal trumpet's in place with some blood present but not briskly or actively bleeding.  No pooling blood in the airway. Eyes:     Comments: Eyes are deviated to the left.  Cardiovascular:     Comments: Irregular.  Distant heart sounds.  Normal rate.  Monitor shows  narrow complex irregular rhythm in the 70s. Pulmonary:     Comments: Respirations are deep slow and regular.  There is some rhonchi present on auscultation.  Bilateral breath sounds.  Questionable crackle on the left. Abdominal:     Comments: Abdomen soft.  Not significantly distended.  Musculoskeletal:     Comments: No significant peripheral edema.  No appearance of any extremity deformities.  Extremities are warm and dry to the touch.  Skin:    General: Skin is warm and dry.  Neurological:     Comments: Patient is obtunded.  She has leftward gaze.  She is keeping the eyes closed.  With attempts at forced lid opening patient does grimace and closes lids more tightly.  Patient is doing some spontaneous motion.  With her left hand she was reaching which appears to be trying to move her garment.  To noxious stimulus patient does move each extremity.  Patient is not showing any active posturing or tonic-clonic movement.  She is somewhat stiff.  On arrival jaw is clenched.     (all labs ordered are listed, but only abnormal results are displayed) Labs Reviewed  I-STAT CHEM 8, ED  I-STAT VENOUS BLOOD GAS, ED  I-STAT CG4 LACTIC ACID, ED    EKG: None  Radiology: No results found.  {Document cardiac  monitor, telemetry assessment procedure when appropriate:32947} Date/Time: 03/14/2024 10:53 AM  Performed by: Armenta Canning, MDOxygen Delivery Method: Ambu bag Preoxygenation: Pre-oxygenation with 100% oxygen Induction Type: Rapid sequence Ventilation: Mask ventilation without difficulty Laryngoscope Size: Glidescope and 3 Tube size: 7.5 mm Number of attempts: 1 Placement Confirmation: ETT inserted through vocal cords under direct vision, Positive ETCO2 and Breath sounds checked- equal and bilateral Secured at: 24 cm Tube secured with: ETT holder Dental Injury: Teeth and Oropharynx as per pre-operative assessment  Comments: Patient intubated without difficulty.  Scant amount of blood in  the airway from nasal trumpets but no pooling or large amount.  The glottis was clear and open without any secretions or blood in or around it.       Medications Ordered in the ED - No data to display    {Click here for ABCD2, HEART and other calculators REFRESH Note before signing:1}                              Medical Decision Making Amount and/or Complexity of Data Reviewed Labs: ordered. Radiology: ordered.  Risk Prescription drug management.   Patient presents as outlined.  We have very limited history.  She reportedly is on Eliquis.  On auscultation it sounds like she has a valve replacement and there is a anterior chest wall scar.  Appearance is most suggestive of obtundation with seizure activity or stroke.  Patient required immediate intubation.  Oxygen saturation was not 100% but jaws clenched and airway is poorly protected.   Patient intubated without difficulty.  Patient was given Versed, etomidate and rocuronium.  At this time patient will need immediate CT scan head and neck.  Will proceed with diagnostic evaluation and try to obtain additional history.  Medical record indicates patient has mitral valve replacement and is anticoagulated on Coumadin.  She has been stable on this following up with cardiology.  It appears to be very limited to other medical problems.  Consult: Dr. Lindzen for neurology.  10: 58  {Document critical care time when appropriate  Document review of labs and clinical decision tools ie CHADS2VASC2, etc  Document your independent review of radiology images and any outside records  Document your discussion with family members, caretakers and with consultants  Document social determinants of health affecting pt's care  Document your decision making why or why not admission, treatments were needed:32947:::1}   Final diagnoses:  None    ED Discharge Orders     None

## 2024-03-14 NOTE — Progress Notes (Addendum)
 Arrived to room for STAT EEG. RN asked to hold off at this time pt is going to IR. Will try back as schedule permits.

## 2024-03-14 NOTE — Progress Notes (Signed)
 EEG complete - results pending

## 2024-03-14 NOTE — H&P (Addendum)
 NEUROLOGY ADMISSION HISTORY AND PHYSICAL   Date of service: March 14, 2024 Patient Name: Miranda Parsons MRN:  [id number] DOB:  March 02, 1936 Chief Complaint: Patient found unresponsive Requesting Provider: Armenta Canning, MD  History of Present Illness  Miranda Parsons is a 89 y.o. female with a PMH of CAD, HTN, mechanical heart valve on Coumadin and stroke who was found down unresponsive on the floor at home by husband with unknown downtime. He last saw her at her baseline last night - husband has dementia and was unsure of time.  She did speak with her family on the phone at about 9 or 10 PM last night.  EMS was called around 0940. The patient was unresponsive and gurgling upon EMS arrival. Vitals per EMS: 150/70; HR 70; ETC02 40; Cbg 140; Unable to obtain 02 sat. She was being bagged on arrival to the ED and continued to be unresponsive with left gaze deviation.   Shortly after arrival to the ED, the patient was noted to be seizing, with eye gaze to the left.  Patient was intubated for airway protection.  Admission head CT revealed hyperdense left ICA, and code stroke was activated given symptoms and possible LVO.  Left ICA occlusion was noted on CT angiogram with favorable perfusion scan showing large territory hypoperfusion without core infarct.  Risks and benefits of mechanical thrombectomy were discussed with family, and decision was made to proceed with intervention.  Patient's family states they would like her to remain full code at this time.  LKW: Yesterday night at 9 or 10 PM Modified rankin score: 1-No significant post stroke disability and can perform usual duties with stroke symptoms IV Thrombolysis: No, outside of window EVT: Yes, left ICA and possible left vertebral artery occlusion  NIHSS components Score: Comment  1a Level of Conscious 0[]  1[]  2[]  3[x]      1b LOC Questions 0[]  1[]  2[x]       1c LOC Commands 0[]  1[]  2[x]       2 Best Gaze 0[]  1[]  2[x]       3 Visual 0[]   1[]  2[]  3[x]      4 Facial Palsy 0[x]  1[]  2[]  3[]      5a Motor Arm - left 0[]  1[]  2[]  3[]  4[x]  UN[]    5b Motor Arm - Right 0[]  1[]  2[]  3[]  4[x]  UN[]    6a Motor Leg - Left 0[]  1[]  2[]  3[]  4[x]  UN[]    6b Motor Leg - Right 0[]  1[]  2[]  3[]  4[x]  UN[]    7 Limb Ataxia 0[]  1[]  2[]  UN[x]      8 Sensory 0[]  1[]  2[x]  UN[]      9 Best Language 0[]  1[]  2[]  3[x]      10 Dysarthria 0[]  1[]  2[]  UN[x]      11 Extinct. and Inattention 0[x]  1[]  2[]       TOTAL:  33       ROS   Unable to ascertain due to patient being unresponsive.  Past History   Past Medical History:  Diagnosis Date   CAD (coronary artery disease)    Hypertension    Stroke [facility])     History reviewed. No pertinent surgical history.  Family History: History reviewed. No pertinent family history.  Social History  reports that she has quit smoking. Her smoking use included cigarettes. She has never used smokeless tobacco. She reports that she does not drink alcohol and does not use drugs.  No Known Allergies  Medications   Current Facility-Administered Medications:    etomidate (AMIDATE) injection, , Intravenous, Code/Trauma/Sedation Med,  Armenta Canning, MD, 10 mg at Mar 23, 2024 1029   midazolam (VERSED) injection, , Intravenous, Code/Trauma/Sedation Med, Armenta Canning, MD, 5 mg at 03-23-2024 1028   propofol (DIPRIVAN) 1000 MG/100ML infusion, 0-80 mcg/kg/min, Intravenous, Titrated, Pfeiffer, Canning, MD   rocuronium (ZEMURON) injection, , Intravenous, Code/Trauma/Sedation Med, Armenta Canning, MD, 80 mg at 03-23-2024 1029 No current outpatient medications on file.  Vitals   Vitals:   2024-03-23 1039 March 23, 2024 1040 2024-03-23 1045 2024-03-23 1050  BP:  120/70 (!) 143/57 (!) 114/51  Pulse: 75  (!) 54   Resp: 18 (!) 21 18 18   Temp:      TempSrc:      SpO2: 99%  99%   Weight:  52.2 kg    Height:  5' 5 (1.651 m)      Body mass index is 19.14 kg/m.   Physical Exam   Constitutional: Well-developed, well-nourished, unresponsive  elderly patient Eyes: No scleral injection.  HENT: ET tube in place Head: Normocephalic.  Cardiovascular: Normal rate and regular rhythm.  Respiratory: Respirations synchronous with ventilator Skin: WDI.   Neurologic Examination   (Exam performed after intubation with rocuronium administered, patient on low-dose propofol) pupils small but reactive to light, negative oculocephalic reflex, does not track examiner or blink to threat, no response to noxious stimuli  Labs/Imaging/Neurodiagnostic studies   CBC:  Recent Labs  Lab 03/23/24 1028 03-23-2024 1038 03-23-24 1039  WBC 7.8  --   --   NEUTROABS 6.0  --   --   HGB 10.0* 10.9* 10.9*  HCT 31.9* 32.0* 32.0*  MCV 106.7*  --   --   PLT 167  --   --    Basic Metabolic Panel:  Lab Results  Component Value Date   NA 138 23-Mar-2024   K 4.5 23-Mar-2024   GLUCOSE 170 (H) 03-23-2024   BUN 36 (H) 2024/03/23   CREATININE 2.30 (H) 03-23-2024     ASSESSMENT  Miranda Parsons is an 89 y.o. female with history of CAD, HTN, mechanical heart valve on Coumadin and stroke who presented after being found unresponsive at home by her husband.  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  She was found to have a seizure after admission with left gaze deviation and was intubated for airway protection.  Head CT demonstrated hyperdense left ICA, and patient was taken for CT angiogram of the head and neck with CT perfusion.  This demonstrated LVO in the left ICA as well as occlusion of the left vertebral artery.  CT perfusion demonstrated large penumbra but no core infarct, but there is a concern that this was pseudonormalization given aspects of 5.  Risks and benefits of mechanical thrombectomy were discussed with patient's family, and she was taken to interventional radiology for this procedure. - Exam reveals an unresponsive intubated patient, but exam was conducted after  patient had received rocuronium for intubation - CT head: Strong evidence of ELVO: Hyperdense Left ICA terminus and MCA, with fairly widespread cytotoxic edema in the Left MCA territory. ASPECTS 5. No hemorrhagic transformation or midline shift. Underlying chronic ischemic disease in the right corona radiata, right basal ganglia, right greater than left cerebellum. - On personal review of the CT images by Neurology, the hypodensity seen in the left MCA territory by Radiology is not definitively seen and CTP findings of no core infarct are felt more likely to be most accurate, with ASPECTS likely to  be significantly more favorable than 5.  - CT C-spine: No acute traumatic injury identified in the cervical spine. Satisfactory visible course of endotracheal and enteric tubes. Chronic cervical spine degeneration. - CTA head and neck with perfusion: Positive for left ICA occlusion at the skull base, occlusion of dominant left vertebral artery at left V4 segment, CT perfusion does not detect left MCA territory infarct core with possible pseudonormalization and 179 mL penumbra  PLAN  # Acute ischemic stroke - Admit to ICU for post mechanical thrombectomy monitoring - Blood pressure parameters per interventional radiologist postprocedure, usually keep systolic blood pressure between 120-160 - Groin site checks per interventional radiologist - MRI brain wo contrast - TTE  - Check A1c and LDL + add statin per guidelines - Will restart Coumadin after procedure - Vital signs and NIHSS every 15 minutes x 2 hours, every 30 minutes x 6 hours and hourly thereafter - STAT head CT for any change in neuro exam - Tele - PT/OT/SLP - Stroke education - Amb referral to neurology upon discharge     # Suspected seizure activity, new onset - Loading with Keppra 2000 mg IV STAT - Continue Keppra at 500 mg IV BID (ordered) - STAT EEG (ordered) - Inpatient seizure precautions   Addendum: Repeat CT head at  1554: 1. Ill-defined hyperdensity within the basal ganglia region, insula and frontotemporal operculum on the left. The appearance is most suggestive of enhancement/contrast staining. Associated mass effect at these sites consistent with an underlying acute left MCA territory infarct. Partial effacement of the left lateral ventricle. 3 mm rightward midline shift. 2. There is residual circulating intravascular contrast. Within this limitation, no definite subarachnoid hemorrhage is identified. 3. Asymmetrically prominent vascular enhancement within the left MCA vascular territory, suggesting slow flow. 4. Background parenchymal atrophy and chronic small vessel ischemic disease. 5. Paranasal sinus disease as described. - Starting hypertonic saline at 50 cc/hr  Addendum: EEG:  Continuous slow, generalized. This study is suggestive of moderate to severe diffuse encephalopathy. No seizures or epileptiform discharges were seen throughout the recording. ______________________________________________________________________  Patient seen by NP with MD. Earle FORBES Everitt Clint Abbey , MSN, AGACNP-BC [facility] See Amion for schedule and pager information 03/14/2024 12:35 PM    Signed, MERRIANNE Teng Decou, MD [facility]

## 2024-03-14 NOTE — ED Triage Notes (Signed)
 Pt arrived via [facility] from home. Pt's husband went to get pt this am and noted pt was on the floor and not responding. He last saw her last night. Husband has dementia and was unsure of time. EMS was called around 0940. Pt was unresponsive and gurgling upon EMS arrival. NPA x 2 were inserted, pt being bagged on arrival. Pt continues to be unresponsive on arrival.  EMS VS 150/70 HR 70 ETC02 40 Cbg 140 Unable to obtain 02 sat

## 2024-03-14 NOTE — Plan of Care (Signed)
  Problem: Health Behavior/Discharge Planning: Goal: Ability to manage health-related needs will improve Outcome: Progressing Goal: Goals will be collaboratively established with patient/family Outcome: Progressing   Problem: Nutrition: Goal: Risk of aspiration will decrease Outcome: Progressing

## 2024-03-14 NOTE — Sedation Documentation (Signed)
 Patient transported to PACU with this RN, CRNA, RT and MD. Bedside report given to Bernardino, RN. Right femoral site assessed, sited level 0, no hematoma, dressing clean and intact.

## 2024-03-14 NOTE — Transfer of Care (Signed)
 Immediate Anesthesia Transfer of Care Note  Patient: Miranda Parsons  Procedure(s) Performed: RADIOLOGY WITH ANESTHESIA  Patient Location: PACU  Anesthesia Type:General  Level of Consciousness: sedated and Patient remains intubated per anesthesia plan  Airway & Oxygen Therapy: Patient remains intubated per anesthesia plan and Patient placed on Ventilator (see vital sign flow sheet for setting)  Post-op Assessment: Report given to RN and Post -op Vital signs reviewed and stable  Post vital signs: Reviewed and stable  Last Vitals:  Vitals Value Taken Time  BP 124/83 03/14/24 13:18  Temp    Pulse 65 03/14/24 13:23  Resp 18 03/14/24 13:23  SpO2 100 % 03/14/24 13:23  Vitals shown include unfiled device data.  Last Pain:  Vitals:   03/14/24 1037  TempSrc: Axillary         Complications: No notable events documented.

## 2024-03-14 NOTE — Progress Notes (Signed)
Patient was transported to CT & back to 4N26 without any complications.

## 2024-03-14 NOTE — Progress Notes (Signed)
 Patient was transported to PACU from IR via the ventilator with no complications.

## 2024-03-15 ENCOUNTER — Encounter (HOSPITAL_COMMUNITY): Payer: Self-pay | Admitting: Radiology

## 2024-03-15 ENCOUNTER — Inpatient Hospital Stay (HOSPITAL_COMMUNITY)

## 2024-03-15 DIAGNOSIS — I63232 Cerebral infarction due to unspecified occlusion or stenosis of left carotid arteries: Secondary | ICD-10-CM

## 2024-03-15 DIAGNOSIS — I6782 Cerebral ischemia: Secondary | ICD-10-CM | POA: Diagnosis not present

## 2024-03-15 DIAGNOSIS — I63512 Cerebral infarction due to unspecified occlusion or stenosis of left middle cerebral artery: Secondary | ICD-10-CM | POA: Diagnosis not present

## 2024-03-15 DIAGNOSIS — I361 Nonrheumatic tricuspid (valve) insufficiency: Secondary | ICD-10-CM

## 2024-03-15 DIAGNOSIS — R233 Spontaneous ecchymoses: Secondary | ICD-10-CM

## 2024-03-15 DIAGNOSIS — J9600 Acute respiratory failure, unspecified whether with hypoxia or hypercapnia: Secondary | ICD-10-CM

## 2024-03-15 DIAGNOSIS — I6389 Other cerebral infarction: Secondary | ICD-10-CM

## 2024-03-15 DIAGNOSIS — R29727 NIHSS score 27: Secondary | ICD-10-CM

## 2024-03-15 DIAGNOSIS — T45516A Underdosing of anticoagulants, initial encounter: Secondary | ICD-10-CM

## 2024-03-15 DIAGNOSIS — R29818 Other symptoms and signs involving the nervous system: Secondary | ICD-10-CM | POA: Diagnosis not present

## 2024-03-15 DIAGNOSIS — I69391 Dysphagia following cerebral infarction: Secondary | ICD-10-CM

## 2024-03-15 DIAGNOSIS — E440 Moderate protein-calorie malnutrition: Secondary | ICD-10-CM | POA: Insufficient documentation

## 2024-03-15 DIAGNOSIS — I609 Nontraumatic subarachnoid hemorrhage, unspecified: Secondary | ICD-10-CM | POA: Diagnosis not present

## 2024-03-15 DIAGNOSIS — Z8673 Personal history of transient ischemic attack (TIA), and cerebral infarction without residual deficits: Secondary | ICD-10-CM | POA: Diagnosis not present

## 2024-03-15 LAB — BASIC METABOLIC PANEL WITH GFR
Anion gap: 12 (ref 5–15)
BUN: 30 mg/dL — ABNORMAL HIGH (ref 8–23)
CO2: 18 mmol/L — ABNORMAL LOW (ref 22–32)
Calcium: 8.8 mg/dL — ABNORMAL LOW (ref 8.9–10.3)
Chloride: 114 mmol/L — ABNORMAL HIGH (ref 98–111)
Creatinine, Ser: 2.11 mg/dL — ABNORMAL HIGH (ref 0.44–1.00)
GFR, Estimated: 22 mL/min — ABNORMAL LOW (ref >60)
Glucose, Bld: 124 mg/dL — ABNORMAL HIGH (ref 70–99)
Potassium: 4.9 mmol/L (ref 3.5–5.1)
Sodium: 144 mmol/L (ref 135–145)

## 2024-03-15 LAB — LIPID PANEL
Cholesterol: 130 mg/dL (ref 0–200)
HDL: 58 mg/dL (ref >40)
LDL Cholesterol: 53 mg/dL (ref 0–99)
Total CHOL/HDL Ratio: 2.2 ratio
Triglycerides: 93 mg/dL (ref <150)
VLDL: 19 mg/dL (ref 0–40)

## 2024-03-15 LAB — LACTIC ACID, PLASMA
Lactic Acid, Venous: 4.3 mmol/L (ref 0.5–1.9)
Lactic Acid, Venous: 2.3 mmol/L (ref 0.5–1.9)

## 2024-03-15 LAB — CBC
HCT: 34.1 % — ABNORMAL LOW (ref 36.0–46.0)
Hemoglobin: 10.5 g/dL — ABNORMAL LOW (ref 12.0–15.0)
MCH: 33.9 pg (ref 26.0–34.0)
MCHC: 30.8 g/dL (ref 30.0–36.0)
MCV: 110 fL — ABNORMAL HIGH (ref 80.0–100.0)
Platelets: 153 K/uL (ref 150–400)
RBC: 3.1 MIL/uL — ABNORMAL LOW (ref 3.87–5.11)
RDW: 12.1 % (ref 11.5–15.5)
WBC: 9 K/uL (ref 4.0–10.5)
nRBC: 0 % (ref 0.0–0.2)

## 2024-03-15 LAB — GLUCOSE, CAPILLARY
Glucose-Capillary: 114 mg/dL — ABNORMAL HIGH (ref 70–99)
Glucose-Capillary: 117 mg/dL — ABNORMAL HIGH (ref 70–99)
Glucose-Capillary: 117 mg/dL — ABNORMAL HIGH (ref 70–99)
Glucose-Capillary: 124 mg/dL — ABNORMAL HIGH (ref 70–99)
Glucose-Capillary: 124 mg/dL — ABNORMAL HIGH (ref 70–99)
Glucose-Capillary: 126 mg/dL — ABNORMAL HIGH (ref 70–99)
Glucose-Capillary: 142 mg/dL — ABNORMAL HIGH (ref 70–99)

## 2024-03-15 LAB — SODIUM
Sodium: 147 mmol/L — ABNORMAL HIGH (ref 135–145)
Sodium: 153 mmol/L — ABNORMAL HIGH (ref 135–145)

## 2024-03-15 LAB — MAGNESIUM: Magnesium: 1.9 mg/dL (ref 1.7–2.4)

## 2024-03-15 LAB — PROTIME-INR
INR: 2.2 — ABNORMAL HIGH (ref 0.8–1.2)
Prothrombin Time: 25.3 s — ABNORMAL HIGH (ref 11.4–15.2)

## 2024-03-15 LAB — PHOSPHORUS: Phosphorus: 3.9 mg/dL (ref 2.5–4.6)

## 2024-03-15 MED ORDER — FAMOTIDINE 20 MG PO TABS
10.0000 mg | ORAL_TABLET | Freq: Every day | ORAL | Status: DC
  Administered 2024-03-15 – 2024-03-19 (×5): 10 mg
  Filled 2024-03-15 (×5): qty 1

## 2024-03-15 MED ORDER — MIDAZOLAM HCL 2 MG/2ML IJ SOLN: 2.0000 mg | Freq: Once | INTRAMUSCULAR | Status: DC

## 2024-03-15 MED ORDER — HYDRALAZINE HCL 20 MG/ML IJ SOLN: 10.0000 mg | INTRAMUSCULAR | Status: DC | PRN

## 2024-03-15 MED ORDER — LACTATED RINGERS IV SOLN: INTRAVENOUS | Status: AC

## 2024-03-15 MED ORDER — ORAL CARE MOUTH RINSE: 15.0000 mL | OROMUCOSAL | Status: DC | PRN

## 2024-03-15 MED ORDER — MIDAZOLAM HCL 2 MG/2ML IJ SOLN: 4.0000 mg | Freq: Once | INTRAMUSCULAR | Status: AC

## 2024-03-15 MED ORDER — ASPIRIN 81 MG PO CHEW
81.0000 mg | CHEWABLE_TABLET | Freq: Every day | ORAL | Status: DC
  Administered 2024-03-15 – 2024-03-16 (×2): 81 mg
  Filled 2024-03-15 (×2): qty 1

## 2024-03-15 MED ORDER — THIAMINE MONONITRATE 100 MG PO TABS
100.0000 mg | ORAL_TABLET | Freq: Every day | ORAL | Status: AC
  Administered 2024-03-15 – 2024-03-21 (×7): 100 mg
  Filled 2024-03-15 (×7): qty 1

## 2024-03-15 MED ORDER — OSMOLITE 1.5 CAL PO LIQD
1000.0000 mL | ORAL | Status: DC
  Administered 2024-03-15 – 2024-03-20 (×6): 1000 mL

## 2024-03-15 MED ORDER — LACTATED RINGERS IV BOLUS
1000.0000 mL | Freq: Once | INTRAVENOUS | Status: AC
  Administered 2024-03-15: 1000 mL via INTRAVENOUS

## 2024-03-15 MED ORDER — MIDAZOLAM HCL 2 MG/2ML IJ SOLN
INTRAMUSCULAR | Status: AC
  Administered 2024-03-15: 2 mg
  Filled 2024-03-15: qty 4

## 2024-03-15 MED ORDER — ORAL CARE MOUTH RINSE
15.0000 mL | OROMUCOSAL | Status: DC
  Administered 2024-03-15 – 2024-03-21 (×77): 15 mL via OROMUCOSAL

## 2024-03-15 NOTE — Progress Notes (Addendum)
 PT Cancellation Note  Patient Details Name: Miranda Parsons MRN: [id number] DOB: March 27, 1936   Cancelled Treatment:    Reason Eval/Treat Not Completed: (P) Patient not medically ready. Pt currently intubated with RN requesting PT check back later as time permits as plan may be to extubate today.  Addendum 16:43 - Pt still intubated. RN requesting PT check back tomorrow as able.   Theo Ferretti, PT, DPT Acute Rehabilitation Services  Office: [phone]   Theo CHRISTELLA Ferretti 03/15/2024, 8:55 AM

## 2024-03-15 NOTE — Progress Notes (Addendum)
 STROKE TEAM PROGRESS NOTE    SIGNIFICANT HOSPITAL EVENTS  10/1: Patient found down at home, unresponsive with left gaze.   NIH 38 on admission.   CTA showed left ICA occlusion Underwent successful left ICA thrombectomy  INTERIM HISTORY/SUBJECTIVE  No family at bedside on rounds this morning.   On exam, No corneal left. Small, but reactive pupils. Minimal movement of RUE Withdraw of all extremities, L>R  OK from neuro to wean sedation and consider extubation  Will discuss TEE with cardiology with TTE findings of possible vegetation.    Addendum: at length discussion at bedside with patient's daughter-in-law this afternoon. Discussed likely need for further goals of care discussions among family members. Patient had not shared her wished previously.   OBJECTIVE  CBC    Component Value Date/Time   WBC 9.0 03/15/2024 0559   RBC 3.10 (L) 03/15/2024 0559   HGB 10.5 (L) 03/15/2024 0559   HCT 34.1 (L) 03/15/2024 0559   PLT 153 03/15/2024 0559   MCV 110.0 (H) 03/15/2024 0559   MCH 33.9 03/15/2024 0559   MCHC 30.8 03/15/2024 0559   RDW 12.1 03/15/2024 0559   LYMPHSABS 1.1 03/14/2024 1028   MONOABS 0.6 03/14/2024 1028   EOSABS 0.0 03/14/2024 1028   BASOSABS 0.0 03/14/2024 1028    BMET    Component Value Date/Time   NA 144 03/15/2024 0559   K 4.9 03/15/2024 0559   CL 114 (H) 03/15/2024 0559   CO2 18 (L) 03/15/2024 0559   GLUCOSE 124 (H) 03/15/2024 0559   BUN 30 (H) 03/15/2024 0559   CREATININE 2.11 (H) 03/15/2024 0559   CALCIUM 8.8 (L) 03/15/2024 0559   GFRNONAA 22 (L) 03/15/2024 0559    IMAGING past 24 hours MR BRAIN WO CONTRAST Result Date: 03/15/2024 EXAM: MRI BRAIN WITHOUT CONTRAST 03/15/2024 12:19:49 AM TECHNIQUE: Multiplanar multisequence MRI of the head/brain was performed without the administration of intravenous contrast. COMPARISON: None available. CLINICAL HISTORY: Neuro deficit, acute, stroke suspected. FINDINGS: BRAIN AND VENTRICLES: Widespread cortical  acute/early subacute ischemia throughout the left MCA territory, also affecting the basal ganglia. Additionally, multifocal acute/early subacute ischemia within the left cerebellum. Areas of magnetic susceptibility effect within the left MCA territory may be due to contrast staining. Mass effect on the left lateral ventricle and 4 mm of rightward midline shift. 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 Multifocal hyperintense T2-weighted signal within the cerebral white matter, most commonly due to chronic small vessel disease. No mass. No hydrocephalus. The sella is unremarkable. Normal flow voids. ORBITS: Ocular lens replacements. No acute abnormality. SINUSES AND MASTOIDS: Moderate right maxillary sinus mucosal thickening. Small amount of fluid in the sphenoid sinuses. No acute abnormality. BONES AND SOFT TISSUES: Normal marrow signal. No acute soft tissue abnormality. IMPRESSION: 1. Multifocal acute/early subacute ischemia within the left MCA territory and left cerebellum. 2. Mass effect on the left lateral ventricle and 4 mm of rightward midline shift. 3. Areas of magnetic susceptibility within the left MCA territory may reflect contrast staining. Subarachnoid hemorrhage could have the same appearance. Continued follow-up by head CT will be helpful to differentiate. 4. 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 Multifocal hyperintense T2-weighted signal within the cerebral white matter, most commonly due to chronic small vessel disease. Electronically signed by: Franky Stanford MD 03/15/2024 12:57 AM EDT RP Workstation: [id number]   ECHOCARDIOGRAM COMPLETE Result Date: 03/14/2024    ECHOCARDIOGRAM REPORT   Patient Name:   Miranda Parsons Date of Exam: 03/14/2024 Medical Rec #:  [id number]  Height:       65.0 in Accession #:    [id number]        Weight:       115.0 lb Date of Birth:  1936-02-27          BSA:          1.564 m Patient Age:    89 years          BP:           136/52 mmHg Patient Gender: F                  HR:           63 bpm. Exam Location:  Inpatient Procedure: 2D Echo (Both Spectral and Color Flow Doppler were utilized during            procedure). Indications:    Stroke  History:        Patient has no prior history of Echocardiogram examinations.                  Mitral Valve: mechanical valve valve is present in the mitral                 position.  Sonographer:    Charmaine Gaskins Referring Phys: [phone] ERIC LINDZEN IMPRESSIONS  1. Left ventricular ejection fraction, by estimation, is 60 to 65%. The left ventricle has normal function. The left ventricle has no regional wall motion abnormalities. Left ventricular diastolic function could not be evaluated.  2. Right ventricular systolic function is mildly reduced. The right ventricular size is normal. There is mildly elevated pulmonary artery systolic pressure. The estimated right ventricular systolic pressure is 43.2 mmHg.  3. Left atrial size was severely dilated.  4. Right atrial size was severely dilated.  5. The mitral valve has been repaired/replaced. Trivial mitral valve regurgitation. No evidence of mitral stenosis. The mean mitral valve gradient is 3.0 mmHg with average heart rate of 60 bpm. There is a mechanical valve present in the mitral position.  Echo findings are consistent with normal structure and function of the mitral valve prosthesis.  6. Tricuspid valve regurgitation is moderate.  7. There is a mobile, 4 mm-long filamentous echodensity attached to the left coronary cusp of the aortic valve. This could represent a Lambl's excrescence (normal finding), but it appears thicker than that structure usually appears. There is associated mild aortic insufficiency and a vegetation should be considered. The aortic valve is tricuspid. Aortic valve regurgitation is mild. No aortic stenosis is present. Comparison(s): No prior Echocardiogram. Conclusion(s)/Recommendation(s): Consider TEE if there is clinical suspicion for endocarditis. FINDINGS   Left Ventricle: Left ventricular ejection fraction, by estimation, is 60 to 65%. The left ventricle has normal function. The left ventricle has no regional wall motion abnormalities. The left ventricular internal cavity size was normal in size. There is  no left ventricular hypertrophy. Abnormal (paradoxical) septal motion consistent with post-operative status. Left ventricular diastolic function could not be evaluated due to mitral valve replacement. Left ventricular diastolic function could not be evaluated. Right Ventricle: The right ventricular size is normal. No increase in right ventricular wall thickness. Right ventricular systolic function is mildly reduced. There is mildly elevated pulmonary artery systolic pressure. The tricuspid regurgitant velocity  is 2.88 m/s, and with an assumed right atrial pressure of 10 mmHg, the estimated right ventricular systolic pressure is 43.2 mmHg. Left Atrium: Left atrial size was severely dilated. Right Atrium: Right atrial size was severely dilated.  Pericardium: There is no evidence of pericardial effusion. Mitral Valve: The mitral valve has been repaired/replaced. Trivial mitral valve regurgitation. There is a mechanical valve present in the mitral position. Echo findings are consistent with normal structure and function of the mitral valve prosthesis. No evidence of mitral valve stenosis. MV peak gradient, 12.5 mmHg. The mean mitral valve gradient is 3.0 mmHg with average heart rate of 60 bpm. Tricuspid Valve: The tricuspid valve is normal in structure. Tricuspid valve regurgitation is moderate. Aortic Valve: There is a mobile, 4 mm-long filamentous echodensity attached to the left coronary cusp of the aortic valve. This could represent a Lambl's excrescence (normal finding), but it appears thicker than that structure usually appears. There is associated mild aortic insufficiency and a vegetation should be considered. The aortic valve is tricuspid. Aortic valve  regurgitation is mild. No aortic stenosis is present. Pulmonic Valve: The pulmonic valve was normal in structure. Pulmonic valve regurgitation is mild. No evidence of pulmonic stenosis. Aorta: The aortic root and ascending aorta are structurally normal, with no evidence of dilitation. Venous: IVC assessment for right atrial pressure unable to be performed due to mechanical ventilation. IAS/Shunts: There is right bowing of the interatrial septum, suggestive of elevated left atrial pressure. No atrial level shunt detected by color flow Doppler.  LEFT VENTRICLE PLAX 2D LVIDd:         3.50 cm   Diastology LVIDs:         2.00 cm   LV e' medial:    6.64 cm/s LV PW:         0.90 cm   LV E/e' medial:  19.8 LV IVS:        0.90 cm   LV e' lateral:   8.27 cm/s LVOT diam:     2.00 cm   LV E/e' lateral: 15.9 LV SV:         81 LV SV Index:   52 LVOT Area:     3.14 cm  RIGHT VENTRICLE RV Basal diam:  3.70 cm RV Mid diam:    3.50 cm RV S prime:     8.27 cm/s TAPSE (M-mode): 1.8 cm LEFT ATRIUM              Index         RIGHT ATRIUM           Index LA diam:        6.80 cm  4.35 cm/m    RA Area:     53.40 cm LA Vol (A2C):   201.0 ml 128.55 ml/m  RA Volume:   234.00 ml 149.66 ml/m LA Vol (A4C):   243.0 ml 155.41 ml/m LA Biplane Vol: 239.0 ml 152.86 ml/m  AORTIC VALVE LVOT Vmax:   93.00 cm/s LVOT Vmean:  66.750 cm/s LVOT VTI:    0.258 m  AORTA Ao Root diam: 3.60 cm Ao Asc diam:  2.80 cm MITRAL VALVE                TRICUSPID VALVE MV Area (PHT): 3.10 cm     TR Peak grad:   33.2 mmHg MV Area VTI:   1.77 cm     TR Vmax:        288.00 cm/s MV Peak grad:  12.5 mmHg MV Mean grad:  3.0 mmHg     SHUNTS MV Vmax:       1.76 m/s     Systemic VTI:  0.26 m MV Vmean:  76.9 cm/s    Systemic Diam: 2.00 cm MV Decel Time: 245 msec MV E velocity: 131.50 cm/s Jerel Balding MD Electronically signed by Jerel Balding MD Signature Date/Time: 03/14/2024/5:10:17 PM    Final    EEG adult Result Date: 03/14/2024 Shelton Arlin KIDD, MD      03/14/2024  5:00 PM Patient Name: Miranda Parsons MRN: [id number] Epilepsy Attending: Arlin KIDD Shelton Referring Physician/Provider: Armenta Canning, MD Date: 03/14/2024 Duration: 22.55 mins Patient history:  89 y.o. female with history of CAD, HTN, mechanical heart valve on Coumadin and stroke who presented after being found unresponsive at home by her husband. EEG to evaluate for seizure Level of alertness:  comatose AEDs during EEG study: LEV, propofol Technical aspects: This EEG study was done with scalp electrodes positioned according to the 10-20 International system of electrode placement. Electrical activity was reviewed with band pass filter of 1-70Hz , sensitivity of 7 uV/mm, display speed of 20mm/sec with a 60Hz  notched filter applied as appropriate. EEG data were recorded continuously and digitally stored.  Video monitoring was available and reviewed as appropriate. Description: EEG showed continuous generalized 3 to 6 Hz theta-delta slowing admixed with 13-14hz  beta activity distributed symmetrically and diffusely.  Hyperventilation and photic stimulation were not performed.   ABNORMALITY - Continuous slow, generalized IMPRESSION: This study is suggestive of moderate to severe diffuse encephalopathy. No seizures or epileptiform discharges were seen throughout the recording. Arlin KIDD Shelton   DG Abd Portable 1V Result Date: 03/14/2024 CLINICAL DATA:  Nasogastric tube placement. EXAM: DG ABD PORTABLE 1V COMPARISON:  None Available. FINDINGS: Tip and side port of the enteric tube below the diaphragm in the stomach. Excreted IV contrast in both renal collecting systems. Left lung base opacity and possible effusion. The heart is enlarged. IMPRESSION: Tip and side port of the enteric tube below the diaphragm in the stomach. Electronically Signed   By: Andrea Gasman M.D.   On: 03/14/2024 16:25   CT HEAD POST STROKE FOLLOWUP/TIMED/STAT READ Result Date: 03/14/2024 CLINICAL DATA:  Code stroke. Neuro  deficit, acute, stroke suspected. EXAM: CT HEAD WITHOUT CONTRAST TECHNIQUE: Contiguous axial images were obtained from the base of the skull through the vertex without intravenous contrast. RADIATION DOSE REDUCTION: This exam was performed according to the departmental dose-optimization program which includes automated exposure control, adjustment of the mA and/or kV according to patient size and/or use of iterative reconstruction technique. COMPARISON:  Non-contrast head CT and CT angiogram head/neck 03/14/2024. Report from catheter based angiography 03/14/2024. FINDINGS: Brain: Generalized cerebral and cerebellar atrophy. Ill-defined hyperdensity within the basal ganglia region, insula and frontotemporal operculum on the left. The appearance is most suggestive of enhancement/contrast staining. Associated mass effect at these sites consistent with an underlying acute left MCA territory infarct. Partial effacement of the left lateral ventricle. 3 mm rightward midline shift. There is residual intravascular contrast. Within this limitation, no definite subarachnoid hemorrhage is identified. Background patchy and ill-defined hypoattenuation within the cerebral white matter, nonspecific but compatible with chronic small vessel ischemic disease. No evidence of an intracranial mass. Vascular: Atherosclerotic calcifications. Residual circulating intravascular contrast limits evaluation for hyperdense vessels. Asymmetrically prominent vascular enhancement within the left MCA vascular territory, suggesting slow flow. Skull: No calvarial fracture or aggressive osseous lesion. Sinuses/Orbits: No mass or acute finding within the imaged orbits. Moderate-sized fluid level within the right maxillary sinus. Small fluid levels within the bilateral sphenoid sinuses. Small-volume fluid scattered within bilateral mastoid air cells. Impressions #1, #2 and #3 will be called to the  ordering clinician or representative by the Radiologist  Assistant, and communication documented in the PACS or [employer]. IMPRESSION: 1. Ill-defined hyperdensity within the basal ganglia region, insula and frontotemporal operculum on the left. The appearance is most suggestive of enhancement/contrast staining. Associated mass effect at these sites consistent with an underlying acute left MCA territory infarct. Partial effacement of the left lateral ventricle. 3 mm rightward midline shift. 2. There is residual circulating intravascular contrast. Within this limitation, no definite subarachnoid hemorrhage is identified. 3. Asymmetrically prominent vascular enhancement within the left MCA vascular territory, suggesting slow flow. 4. Background parenchymal atrophy and chronic small vessel ischemic disease. 5. Paranasal sinus disease as described. Electronically Signed   By: Rockey Childs D.O.   On: 03/14/2024 16:18   IR PERCUTANEOUS ART THROMBECTOMY/INFUSION INTRACRANIAL INC DIAG ANGIO Result Date: 03/14/2024 PROCEDURE PERFORMED: 1. Cerebral angiography with stroke thrombectomy 2. Ultrasound guided vascular access 3. 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 Last known well was proximally 10 p.m. last night. Initial NIH stroke scale on presentation was 33. INDICATION: Acute ischemic stroke. ANESTHESIA/SEDATION: General anesthesia was utilized for the procedure. CONTRAST:  Approximately 65 cc Ominipque 300 MEDICATIONS: See MAR FLUOROSCOPY TIME:  Fluoroscopy Time: 11.9 minutes, (722 mGy). COMPLICATIONS: None immediate. BODY OF REPORT: Following a full explanation of the procedure along with the potential associated complications, an informed witnessed consent was obtained. The patient was then placed under general anesthesia by the Department of Anesthesiology  at [facility]. The right femoral access site was prepped and draped in the usual sterile fashion. Ultrasound was used to study the right common femoral artery which was patent. Using real-time ultrasound guidance, a 19 gauge introducer needle was used to access the right common femoral artery. Access was performed at 12:13. A hard copy image ultrasound the saved and stored in PACS. Using this access, a 6 French sheath was placed in the descending thoracic aorta. Next, selective catheterization the left common carotid artery was performed. A selective arteriogram was performed which demonstrated that the left internal carotid artery and left external carotid arteries are occluded. Pretreatment TICI score 0. Next, red 72 catheter was used to selectively catheterize the left internal carotid artery. The first pass was performed at 1226. Additional passes were performed at 12:28 and 12:30 and passes extended into the carotid terminus. A large volume of thrombus was retrieved. A post treatment arteriogram with catheter positioned in the left internal carotid artery demonstrated restoration of antegrade flow through the internal carotid artery into the left ACA and MCA territories. There is no significant residual embolus or slow flow appreciated. Hyperemia was noted within the basal ganglia. Post treatment TICI score 3. I elected at this point to evaluate the vertebral artery collateral circulation as the left vertebral artery was likely occluded. Next, diagnostic catheter was advanced into the left subclavian artery and a selective arteriogram was performed to profiled the right vertebral artery origin. Next, selective catheterization of the right vertebral artery was performed. A selective arteriogram demonstrated at the right vertebral artery was patent. The basilar artery was patent. Opacification of the right PCA was achieved. Inflow was favored from the left PCA via the anterior circulation. After  reviewing the imaging, I elected to terminate the procedure at this point. Evaluation of the right femoral access  site demonstrated that the site was suitable for a closure device. A 6 French Angio-Seal device was deployed without complication. Cone beam CT was then performed to evaluate for intracranial hemorrhage and treatment planning. This demonstrated contrast agent staining within the left basal ganglia and deep white matter. At this point, the patient was transferred to the recovery area remaining mechanically intubated secondary to severity of medical condition. IMPRESSION: 1. 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 2. Diagnostic arteriogram demonstrates suspected embolic occlusion of the left ECA which provides flow to the left vertebral artery. 3. Diagnostic arteriogram demonstrates a patent right vertebral artery with intact vertebrobasilar system. PLAN: 1. To ICU for routine postoperative supportive care. Electronically Signed   By: Maude Naegeli M.D.   On: 03/14/2024 14:23   IR ANGIO VERTEBRAL SEL SUBCLAVIAN INNOMINATE UNI L MOD SED Result Date: 03/14/2024 PROCEDURE PERFORMED: 1. Cerebral angiography with stroke thrombectomy 2. Ultrasound guided vascular access 3. 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 Last known well was proximally 10 p.m. last night. Initial NIH stroke scale on presentation was 33. INDICATION: Acute ischemic stroke. ANESTHESIA/SEDATION: General anesthesia was utilized for the procedure. CONTRAST:  Approximately 65 cc Ominipque 300 MEDICATIONS: See MAR FLUOROSCOPY TIME:  Fluoroscopy Time: 11.9 minutes, (722 mGy). COMPLICATIONS: None immediate. BODY OF REPORT: Following a full explanation of the procedure along with the potential associated  complications, an informed witnessed consent was obtained. The patient was then placed under general anesthesia by the Department of Anesthesiology at [facility]. The right femoral access site was prepped and draped in the usual sterile fashion. Ultrasound was used to study the right common femoral artery which was patent. Using real-time ultrasound guidance, a 19 gauge introducer needle was used to access the right common femoral artery. Access was performed at 12:13. A hard copy image ultrasound the saved and stored in PACS. Using this access, a 6 French sheath was placed in the descending thoracic aorta. Next, selective catheterization the left common carotid artery was performed. A selective arteriogram was performed which demonstrated that the left internal carotid artery and left external carotid arteries are occluded. Pretreatment TICI score 0. Next, red 72 catheter was used to selectively catheterize the left internal carotid artery. The first pass was performed at 1226. Additional passes were performed at 12:28 and 12:30 and passes extended into the carotid terminus. A large volume of thrombus was retrieved. A post treatment arteriogram with catheter positioned in the left internal carotid artery demonstrated restoration of antegrade flow through the internal carotid artery into the left ACA and MCA territories. There is no significant residual embolus or slow flow appreciated. Hyperemia was noted within the basal ganglia. Post treatment TICI score 3. I elected at this point to evaluate the vertebral artery collateral circulation as the left vertebral artery was likely occluded. Next, diagnostic catheter was advanced into the left subclavian artery and a selective arteriogram was performed to profiled the right vertebral artery origin. Next, selective catheterization of the right vertebral artery was performed. A selective arteriogram demonstrated at the right vertebral artery was patent. The  basilar artery was patent. Opacification of the right PCA was achieved. Inflow was favored from the left PCA via the anterior circulation. After reviewing the imaging, I elected to terminate the procedure at this point.  Evaluation of the right femoral access site demonstrated that the site was suitable for a closure device. A 6 French Angio-Seal device was deployed without complication. Cone beam CT was then performed to evaluate for intracranial hemorrhage and treatment planning. This demonstrated contrast agent staining within the left basal ganglia and deep white matter. At this point, the patient was transferred to the recovery area remaining mechanically intubated secondary to severity of medical condition. IMPRESSION: 1. 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 2. Diagnostic arteriogram demonstrates suspected embolic occlusion of the left ECA which provides flow to the left vertebral artery. 3. Diagnostic arteriogram demonstrates a patent right vertebral artery with intact vertebrobasilar system. PLAN: 1. To ICU for routine postoperative supportive care. Electronically Signed   By: Maude Naegeli M.D.   On: 03/14/2024 14:23   IR ANGIO VERTEBRAL SEL VERTEBRAL UNI R MOD SED Result Date: 03/14/2024 PROCEDURE PERFORMED: 1. Cerebral angiography with stroke thrombectomy 2. Ultrasound guided vascular access 3. 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 Last known well was proximally 10 p.m. last night. Initial NIH stroke scale on presentation was 33. INDICATION: Acute ischemic stroke. ANESTHESIA/SEDATION: General anesthesia was utilized for the procedure. CONTRAST:  Approximately 65 cc Ominipque 300 MEDICATIONS: See MAR FLUOROSCOPY TIME:  Fluoroscopy Time: 11.9 minutes, (722 mGy).  COMPLICATIONS: None immediate. BODY OF REPORT: Following a full explanation of the procedure along with the potential associated complications, an informed witnessed consent was obtained. The patient was then placed under general anesthesia by the Department of Anesthesiology at [facility]. The right femoral access site was prepped and draped in the usual sterile fashion. Ultrasound was used to study the right common femoral artery which was patent. Using real-time ultrasound guidance, a 19 gauge introducer needle was used to access the right common femoral artery. Access was performed at 12:13. A hard copy image ultrasound the saved and stored in PACS. Using this access, a 6 French sheath was placed in the descending thoracic aorta. Next, selective catheterization the left common carotid artery was performed. A selective arteriogram was performed which demonstrated that the left internal carotid artery and left external carotid arteries are occluded. Pretreatment TICI score 0. Next, red 72 catheter was used to selectively catheterize the left internal carotid artery. The first pass was performed at 1226. Additional passes were performed at 12:28 and 12:30 and passes extended into the carotid terminus. A large volume of thrombus was retrieved. A post treatment arteriogram with catheter positioned in the left internal carotid artery demonstrated restoration of antegrade flow through the internal carotid artery into the left ACA and MCA territories. There is no significant residual embolus or slow flow appreciated. Hyperemia was noted within the basal ganglia. Post treatment TICI score 3. I elected at this point to evaluate the vertebral artery collateral circulation as the left vertebral artery was likely occluded. Next, diagnostic catheter was advanced into the left subclavian artery and a selective arteriogram was performed to profiled the right vertebral artery origin. Next, selective catheterization of  the right vertebral artery was performed. A selective arteriogram demonstrated at the right vertebral artery was patent. The basilar artery was patent. Opacification of the right PCA was achieved. Inflow was favored from the left PCA via the anterior circulation. After reviewing the imaging, I elected to terminate  the procedure at this point. Evaluation of the right femoral access site demonstrated that the site was suitable for a closure device. A 6 French Angio-Seal device was deployed without complication. Cone beam CT was then performed to evaluate for intracranial hemorrhage and treatment planning. This demonstrated contrast agent staining within the left basal ganglia and deep white matter. At this point, the patient was transferred to the recovery area remaining mechanically intubated secondary to severity of medical condition. IMPRESSION: 1. 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 2. Diagnostic arteriogram demonstrates suspected embolic occlusion of the left ECA which provides flow to the left vertebral artery. 3. Diagnostic arteriogram demonstrates a patent right vertebral artery with intact vertebrobasilar system. PLAN: 1. To ICU for routine postoperative supportive care. Electronically Signed   By: Maude Naegeli M.D.   On: 03/14/2024 14:23   IR CT Head Ltd Result Date: 03/14/2024 PROCEDURE PERFORMED: 1. Cerebral angiography with stroke thrombectomy 2. Ultrasound guided vascular access 3. 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 Last known well was proximally 10 p.m. last night. Initial NIH stroke scale on presentation was 33. INDICATION: Acute ischemic stroke. ANESTHESIA/SEDATION: General anesthesia was utilized for the procedure. CONTRAST:  Approximately 65 cc Ominipque  300 MEDICATIONS: See MAR FLUOROSCOPY TIME:  Fluoroscopy Time: 11.9 minutes, (722 mGy). COMPLICATIONS: None immediate. BODY OF REPORT: Following a full explanation of the procedure along with the potential associated complications, an informed witnessed consent was obtained. The patient was then placed under general anesthesia by the Department of Anesthesiology at [facility]. The right femoral access site was prepped and draped in the usual sterile fashion. Ultrasound was used to study the right common femoral artery which was patent. Using real-time ultrasound guidance, a 19 gauge introducer needle was used to access the right common femoral artery. Access was performed at 12:13. A hard copy image ultrasound the saved and stored in PACS. Using this access, a 6 French sheath was placed in the descending thoracic aorta. Next, selective catheterization the left common carotid artery was performed. A selective arteriogram was performed which demonstrated that the left internal carotid artery and left external carotid arteries are occluded. Pretreatment TICI score 0. Next, red 72 catheter was used to selectively catheterize the left internal carotid artery. The first pass was performed at 1226. Additional passes were performed at 12:28 and 12:30 and passes extended into the carotid terminus. A large volume of thrombus was retrieved. A post treatment arteriogram with catheter positioned in the left internal carotid artery demonstrated restoration of antegrade flow through the internal carotid artery into the left ACA and MCA territories. There is no significant residual embolus or slow flow appreciated. Hyperemia was noted within the basal ganglia. Post treatment TICI score 3. I elected at this point to evaluate the vertebral artery collateral circulation as the left vertebral artery was likely occluded. Next, diagnostic catheter was advanced into the left subclavian artery and a selective arteriogram was  performed to profiled the right vertebral artery origin. Next, selective catheterization of the right vertebral artery was performed. A selective arteriogram demonstrated at the right vertebral artery was patent. The basilar artery was patent. Opacification of the right PCA was achieved. Inflow was favored from the left PCA via the anterior circulation. After reviewing the imaging, I elected to terminate  the procedure at this point. Evaluation of the right femoral access site demonstrated that the site was suitable for a closure device. A 6 French Angio-Seal device was deployed without complication. Cone beam CT was then performed to evaluate for intracranial hemorrhage and treatment planning. This demonstrated contrast agent staining within the left basal ganglia and deep white matter. At this point, the patient was transferred to the recovery area remaining mechanically intubated secondary to severity of medical condition. IMPRESSION: 1. 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 2. Diagnostic arteriogram demonstrates suspected embolic occlusion of the left ECA which provides flow to the left vertebral artery. 3. Diagnostic arteriogram demonstrates a patent right vertebral artery with intact vertebrobasilar system. PLAN: 1. To ICU for routine postoperative supportive care. Electronically Signed   By: Maude Naegeli M.D.   On: 03/14/2024 14:23   CT ANGIO HEAD NECK W WO CM W PERF (CODE STROKE) Result Date: 03/14/2024 CLINICAL DATA:  89 year old female found down unresponsive. Left MCA cytotoxic edema and hyperdense distal left ICA on head CT. EXAM: CT ANGIOGRAPHY HEAD AND NECK CT PERFUSION BRAIN TECHNIQUE: Multidetector CT imaging of the head and neck was performed using the standard protocol during bolus administration of intravenous contrast. Multiplanar CT image reconstructions and MIPs were obtained to evaluate the vascular anatomy. Carotid stenosis measurements (when applicable) are  obtained utilizing NASCET criteria, using the distal internal carotid diameter as the denominator. Multiphase CT imaging of the brain was performed following IV bolus contrast injection. Subsequent parametric perfusion maps were calculated using RAPID software. RADIATION DOSE REDUCTION: This exam was performed according to the departmental dose-optimization program which includes automated exposure control, adjustment of the mA and/or kV according to patient size and/or use of iterative reconstruction technique. CONTRAST:  100mL OMNIPAQUE IOHEXOL 350 MG/ML SOLN COMPARISON:  Head CT 1053 hours today, cervical spine CT today. FINDINGS: CT Brain Perfusion Findings: ASPECTS: 5 CBF (<30%) Volume: Zero, but strongly suspected to be underestimated, possible pseudo normalization. Perfusion (Tmax>6.0s) volume: Mismatch Volume: Indeterminate Infarction Location:Left MCA CTA NECK Skeleton: Cervical spine detailed separately today. Previous sternotomy. No acute osseous abnormality identified. Upper chest: Intubated with endotracheal tube terminating in satisfactory position above the carina. Some retained secretions distal to the tube at the carina and in the both mainstem bronchi. Enteric tube courses in the visible esophagus. Upper lungs well aerated. Other neck: Intubated and oral enteric tube in place. Fluid in the pharynx. Other nonvascular neck soft tissue spaces appear negative. Aortic arch: Calcified aortic atherosclerosis.  3 vessel arch. Right carotid system: Patent with tortuosity and mild atherosclerosis. No stenosis to the skull base. Left carotid system: Left CCA origin calcified plaque without stenosis. Tortuous left CCA at the thoracic inlet. Patent left carotid bifurcation, mild to moderate calcified plaque more affecting the left ECA. Loss of enhancement distal left ICA below the skull base (series 8 image 87). See intracranial findings below. Vertebral arteries: Proximal subclavian arteries patent with  plaque but no significant stenosis. Right vertebral artery origin is patent with adjacent plaque but no significant stenosis. Diminutive right vertebral artery with a late entry into the cervical transverse foramen (series 5, image 227). Right vertebral is patent to the skull base without stenosis. The left vertebral artery arises from the left external carotid circulation. Origin demonstrated on series 10, image 164. There is some calcified atherosclerosis of the proximal vessel, which has a dominant size, and remains patent to the skull base with no extracranial stenosis. Only a tiny left vertebral artery remnant  present in the cervical transverse foramina (series 5, image 206). See intracranial findings below. CTA HEAD Posterior circulation: Left vertebral artery V4 segment is occluded (series 5, image 150). Highly diminutive right vertebral V4 segment distal to the patent right PICA origin, and stenotic proximal to the right vertebrobasilar junction. However, the basilar artery remains patent. Patent basilar tip. Patent PCA origins. Tortuous left P1. Patent but diminutive posterior communicating arteries. Bilateral PCA branches are patent with only mild irregularity. Anterior circulation: Occluded left ICA siphon. Reconstituted left posterior communicating artery, but no significant left ICA reconstitution to the terminus. T occlusion appearance there (series 12, image 40). Faintly reconstituted left ACA A1 appears to be via the anterior communicating artery. Faintly reconstituted left MCA branches. Contralateral right ICA siphon is patent with calcified plaque and mild to moderate cavernous segment stenosis. Patent right ICA terminus. Patent right MCA and ACA origins without stenosis. Anterior communicating artery and bilateral ACA branches are within normal limits. Right MCA M1 segment and right MCA trifurcation are patent. Right MCA branches appear within normal limits. Venous sinuses: Early contrast timing,  not well evaluated. Anatomic variants: Left vertebral artery appears to be dominant and also arises from the external carotid artery on that side. Review of the MIP images confirms the above findings IMPRESSION: 1. 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 Left ICA T-occlusion appearance intracranially, minimal reconstitution of the left MCA and left ACA A1. 2. CT Perfusion does not detect Left MCA territory infarct core, although abnormal Left hemisphere ASPECTS still strongly suspected. Possible pseudonormalization. Large 179 mL volume of oligemia in the left hemisphere by CTP. 3. Dominant Left Vertebral Artery with unusual origin of the Left ECA, occluded in the V4 segment. Tenuous basilar artery patency maintained by at non dominant right vertebral artery which is stenotic in the V4 segment. Small posterior communicating arteries are present. 4. Left ACA A2 segment successfully reconstituted by the anterior communicating artery. 5.  Aortic Atherosclerosis (ICD10-I70.0). #1 initially communicated to Dr. Lindzen at 1145 hours on 03/14/2024 by text page via the [facility] messaging system. Electronically Signed   By: VEAR Hurst M.D.   On: 03/14/2024 12:02   DG Chest Portable 1 View Result Date: 03/14/2024 CLINICAL DATA:  Status post intubation EXAM: PORTABLE CHEST 1 VIEW COMPARISON:  None Available. FINDINGS: Endotracheal tube in place with tip 2.6 cm from the carina. Partially imaged enteric tube with tip outside the field of view. No focal consolidation. No pleural effusions. No pneumothorax. Marked cardiomegaly.  Intact sternotomy wires. IMPRESSION: Endotracheal tube in place. Marked cardiomegaly. Electronically Signed   By: Michaeline Blanch M.D.   On: 03/14/2024 11:43   CT Head Wo Contrast Addendum Date: 03/14/2024 ADDENDUM REPORT: 03/14/2024 11:32 ADDENDUM: Study discussed by telephone with Dr. LUDIVINA PFEIFFER on 03/14/2024 at 1123 hours. Electronically Signed   By: VEAR Hurst M.D.   On: 03/14/2024 11:32    Result Date: 03/14/2024 CLINICAL DATA:  89 year old female found down. Unresponsive. Intubated. EXAM: CT HEAD WITHOUT CONTRAST TECHNIQUE: Contiguous axial images were obtained from the base of the skull through the vertex without intravenous contrast. RADIATION DOSE REDUCTION: This exam was performed according to the departmental dose-optimization program which includes automated exposure control, adjustment of the mA and/or kV according to patient size and/or use of iterative reconstruction technique. COMPARISON:  None Available. FINDINGS: Brain: Basal ganglia vascular calcifications. Chronic appearing encephalomalacia in the right cerebral hemisphere with mild ex vacuo ventricular enlargement, in part related to chronic appearing right corona radiata  and basal ganglia lacunar type infarct. Chronic appearing infarcts in the right superior cerebellar artery territory, bilateral PICA territory. Cytotoxic edema in the left hemisphere, most apparent in the left temporal lobe. But asymmetric loss of gray-white differentiation in the left insula and deep gray matter also. ASPECTS five. No hemorrhagic transformation. No midline shift. Basilar cisterns remain patent. No other acute cerebral edema identified. Vascular: Hyperdense left ICA terminus and left MCA origin (series 6, image 36). Calcified atherosclerosis at the skull base. Skull: No acute osseous abnormality identified. Calvarium appears intact. Sinuses/Orbits: Intubated with mild paranasal sinus fluid and mucosal thickening. Tympanic cavities and mastoids remain well aerated. Other: Intubated with fluid in the pharynx. No acute orbit or scalp soft tissue finding. IMPRESSION: 1. Strong evidence of ELVO: Hyperdense Left ICA terminus and MCA, with fairly widespread cytotoxic edema in the Left MCA territory. ASPECTS 5. No hemorrhagic transformation or midline shift. 2. Underlying chronic ischemic disease in the right corona radiata, right basal ganglia, right  greater than left cerebellum. Electronically Signed: By: VEAR Hurst M.D. On: 03/14/2024 11:18   CT Cervical Spine Wo Contrast Result Date: 03/14/2024 CLINICAL DATA:  89 year old female found down. Unresponsive. Intubated. EXAM: CT CERVICAL SPINE WITHOUT CONTRAST TECHNIQUE: Multidetector CT imaging of the cervical spine was performed without intravenous contrast. Multiplanar CT image reconstructions were also generated. RADIATION DOSE REDUCTION: This exam was performed according to the departmental dose-optimization program which includes automated exposure control, adjustment of the mA and/or kV according to patient size and/or use of iterative reconstruction technique. COMPARISON:  Head CT today. FINDINGS: Alignment: Straightening of cervical lordosis. No significant scoliosis or spondylolisthesis. Cervicothoracic junction alignment is within normal limits. Bilateral posterior element alignment is within normal limits. Skull base and vertebrae: Visualized skull base is intact. No atlanto-occipital dissociation. C1 and C2 appear chronically degenerated, intact and aligned. No acute osseous abnormality identified. Soft tissues and spinal canal: No prevertebral fluid or swelling. No visible canal hematoma. Endotracheal and enteric tubes in place with appropriate visible course into the airway and cervical esophagus, respectively. Calcified carotid atherosclerosis in the neck. Disc levels: Chronic cervical spine degeneration. No high-grade cervical spinal stenosis by CT. Upper chest: Visible upper thoracic levels appear intact. Negative lung apices. IMPRESSION: 1. No acute traumatic injury identified in the cervical spine. 2. Satisfactory visible course of endotracheal and enteric tubes. 3. Chronic cervical spine degeneration. Electronically Signed   By: VEAR Hurst M.D.   On: 03/14/2024 11:20    Vitals:   03/15/24 0515 03/15/24 0530 03/15/24 0545 03/15/24 0600  BP: (!) 139/58 131/71 (!) 137/101 (!) 134/46  Pulse:  (!) 54 (!) 56 (!) 52 (!) 43  Resp: (!) 21 18 16 20   Temp: 98.4 F (36.9 C) 98.4 F (36.9 C) 98.4 F (36.9 C) 98.6 F (37 C)  TempSrc:      SpO2: 100% 100% 100% 100%  Weight:      Height:        PHYSICAL EXAM General: Acutely ill patient, intubated and sedated CV: Sinus arrhythmias on monitor Respiratory: Intubated and mechanically ventilated  On exam, No corneal left. Small, but reactive pupils. Minimal movement of RUE Withdraw of all extremities, L>R NEURO:  Mental Status: Patient is intubated and sedated (low-dose propofol).  She does not open eyes to voice or to noxious stimuli.  She does not follow commands.  Cranial Nerves:  II: PERRL. No blink to threat bilaterally.   III, IV, VI: Does not open eyes, does not track examiner with passive eye  raise.  Corneal reflex intact IX and X: Cough and gag reflex intact  Motor/Sensory: Minimal movement of right upper extremity Minimal withdrawal of all extremities, with left withdrawal greater than right Tone: is normal and bulk is normal Coordination: Able to assess as patient does not follow commands Gait- deferred  Most Recent NIH 27    ASSESSMENT/PLAN  Ms. LASHAYLA ARMES is a 89 y.o. female with a PMH of CAD, HTN, mechanical heart valve on Coumadin and stroke who was found down unresponsive on the floor at home by husband with unknown downtime. He last saw her at her baseline last night - husband has dementia and was unsure of time.  She did speak with her family on the phone at about 9 or 10 PM last night.  EMS was called around 0940. The patient was unresponsive and gurgling upon EMS arrival. She was being bagged on arrival to the ED and continued to be unresponsive with left gaze deviation. Shortly after arrival to the ED, the patient was noted to be seizing, with eye gaze to the left.  Patient was intubated for airway protection. Admission head CT revealed hyperdense left ICA, and code stroke was activated given symptoms  and possible LVO.  Left ICA occlusion was noted on CT angiogram with favorable perfusion scan showing large territory hypoperfusion without core infarct.taken for emergent mechanical thrombectomy, admitted for post procedure care, ICU monitoring and further stroke workup. NIH on Admission 33.  Stroke:  left MCA territory and left cerebellum infarcts with left ICA and MCA occlusion s/p IR with TICI 3 revascularization, etiology: mechanical MV with subtherapeutic INR CT head Hyperdense Left ICA terminus and MCA, with fairly widespread cytotoxic edema in the Left MCA territory. ASPECTS 5.  CTA head & neck Positive for Left ICA occlusion at the skull base/siphon. Left ICA T-occlusion appearance intracranially, minimal reconstitution of the left MCA and left ACA A1. CT perfusion 0/179 cc with pseudonormalization S/p IR with TICI 3:03 passes MRI  Multifocal acute/early subacute ischemia within the left MCA territory and left cerebellum with petechial hemorrhage. Mass effect on the left lateral ventricle and 4 mm of rightward midline shift. 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  CT repeat in a.m Routine EEG suggestive of moderate to severe diffuse encephalopathy.    2D Echo: EF 60-65%, MV replacement, ?vegetation to left aortic valve TEE done at bedside no AV vegetation LDL 53 HgbA1c 5.2 VTE prophylaxis - SCDs today No antithrombotic prior to admission, now on aspirin 81 mg daily, possible transition to heparin IV post repeat CT tomorrow AM. Therapy recommendations:  Pending Disposition:  pending  Acute Respiratory Failure, remains intubated post-procedure Ccm management, appreciate assistance Continue Propofol and Fentanyl Okay to wean sedation as tolerated  Mechanical MV Status post mechanical mitral valve placement On Coumadin at home INR 2.3-> 2.2, subtherapeutic (INR goal at home 2.5-3.5) Currently on aspirin 81 possible transition to heparin IV post repeat CT tomorrow  AM.  Witnessed Seizure Witnessed seizure in ED with left gaze preference.  Intubated for airway protection immediately after. 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 On IV fluid  Other Stroke Risk Factors Advanced Age Coronary artery disease Former smoker  Other Active Problems CKD 3B Cr 2.21->2.30->2.11  Hospital day # 1   Pt seen by Neuro NP/APP with MD. Note/plan to be edited by MD as needed.    Rocky JAYSON Likes, DNP [facility] Please use AMION for contact information & EPIC for messaging.  ATTENDING NOTE: I reviewed above note and agree with the assessment and plan. Pt was seen and examined.   No family at bedside.  Patient still intubated on sedation, eyes closed, not following commands. With forced eye opening, eyes in mid position, not blinking to visual threat, doll's eyes absent, not tracking, bilateral pupil 2 mm, sluggish to light. Corneal reflex present on the left but absent on the right, gag and cough present. Breathing over the vent.  Facial symmetry not able to test due to ET tube.  Tongue protrusion not cooperative. On  pain stimulation, more withdrawal and right upper and lower extremity, less withdrawal on the left upper and lower extremity.  Positive babinski bilaterally. Sensation, coordination and gait not tested.   Patient stroke likely due to subtherapeutic INR.  Currently given petechial hemorrhage on left MCA stroke, will put on baby aspirin.  Today after repeat CT if stable, may consider start heparin IV.  Continue Keppra for seizure prevention.  On 3% saline, may consider taper off if edema stable.  For detailed assessment and plan, please refer to above as I have made changes wherever appropriate.   Ary Cummins, MD PhD Stroke Neurology 03/15/2024 5:30 PM  This patient is critically ill due to left MCA stroke status post thrombectomy, petechial hemorrhage transformation, mechanical mitral valve, respiratory failure and at significant risk of neurological worsening, death form recurrent stroke, hemorrhagic transmission, heart failure, systemic embolism. This patient's care requires constant monitoring of vital signs, hemodynamics, respiratory and cardiac monitoring, review of multiple databases, neurological assessment, discussion with family, other specialists and medical decision making of high complexity. I spent 45 minutes of neurocritical care time in the care of this patient.    To contact Stroke Continuity provider, please refer to [email or web address]. After hours, contact General Neurology

## 2024-03-15 NOTE — Progress Notes (Signed)
 Transported patient to MRI while patient was on the mechanical ventilator. Patient remained stable during transport.

## 2024-03-15 NOTE — Progress Notes (Signed)
   [facility] has been requested to perform a transesophageal echocardiogram on Miranda Parsons for stroke and aortic valve abnormality by Rocky Likes. Dr. Jerri also confirmed clinical request. Trickle tube feeds were initiated but so far only given 10ml over 30 minutes - d/w MD Dr. Cherrie who feels OK to proceed. Tube feeds have been paused.  The patient does NOT have any absolute or relative contraindications to a Transesophageal Echocardiogram (TEE).  The patient has: Current Oxygen Requirement and h/o mechanical valve replacement  Patient is currently intubated/sedated so discussed risks/benefits with patient's husband, son, and daughter-in-law. Husband has some memory difficulty so son help assist with consent. After careful review of history and examination, the risks and benefits of transesophageal echocardiogram have been explained including risks of esophageal damage, perforation (1:10,000 risk), bleeding, pharyngeal hematoma as well as other potential complications associated with conscious sedation including aspiration, arrhythmia, respiratory failure and death. Alternatives to treatment were discussed, questions were answered. They were given time to discuss amongst themselves. Son Dominique agrees for patient to proceed. Consent form signed, witnessed by echo tech Jayson Gaskins, and returned to nurse to place on shadow chart.  Signed, Raphael LOISE Bring, PA-C  03/15/2024 3:06 PM

## 2024-03-15 NOTE — Progress Notes (Signed)
 NAME:  Miranda Parsons, MRN:  [id number], DOB:  1935/09/09, LOS: 1 ADMISSION DATE:  03/14/2024, CONSULTATION DATE:  10/1 REFERRING MD:  Dr. Ray, CHIEF COMPLAINT:  L ICA stroke s/p thrombectomy   History of Present Illness:  Patient is a 89 yo F w/ pertinent PMH CAD, HTN, mechanical heart valve on coumadin, dementia, previous stroke brought to [facility] ED on 10/1 code stroke.  On 10/1 patient found down by husband in am, unresponsive on floor. LKN last night 9-10 pm on 9/30. EMS called. Unresponsive w/ left gaze deviation. Bp 150/70 and cbg 140. Patient bagged en route to [facility] ED. On arrival patient with possible seizure w/ left gaze deviation. Given versed and loaded w/ keppra. Intubated for airway protection. CT head showing hyperdense L ICA. CTA head L ICA occlusion. Patient taken to IR for thrombectomy post TICI 3 revascularization. Post op intubated/sedated and brought to 4N icu.   Pertinent ED Labs: creat 2.2, LA 2.2  Pertinent  Medical History   Past Medical History:  Diagnosis Date   CAD (coronary artery disease)    Hypertension    Stroke (HCC)      Significant Hospital Events: Including procedures, antibiotic start and stop dates in addition to other pertinent events   10/1 admit L ICA stroke s/p thrombectomy  Interim History / Subjective:  Repeat imaging yesterday w/ some mass effect w/ 3mm rightward shift started on hypertonic saline  Objective    Blood pressure (!) 134/46, pulse (!) 43, temperature 98.6 F (37 C), resp. rate 20, height 5' 5 (1.651 m), weight 52.2 kg, SpO2 100%.    Vent Mode: PRVC FiO2 (%):  [40 %-100 %] 40 % Set Rate:  [18 bmp] 18 bmp Vt Set:  [450 mL] 450 mL PEEP:  [5 cmH20] 5 cmH20 Plateau Pressure:  [14 cmH20-16 cmH20] 16 cmH20   Intake/Output Summary (Last 24 hours) at 03/15/2024 0839 Last data filed at 03/15/2024 0600 Gross per 24 hour  Intake 1883.49 ml  Output 1265 ml  Net 618.49 ml   Filed Weights   03/14/24 1040  Weight: 52.2 kg     Examination: General:  critically ill appearing on mech vent HEENT: MM pink/moist; ETT in place Neuro: paused prop; not following commands; rue flaccid; moving lue and ble spontaneously; perrl CV: s1s2, RRR, no m/r/g PULM:  dim clear BS bilaterally; on mech vent PRVC GI: soft, bsx4 active  Extremities: warm/dry, no edema  Skin: no rashes or lesions    Resolved problem list   Assessment and Plan   L ICA stroke s/p thrombectomy TICI 3 Cerebral edema w/ mass effect and rightward midline shift Possible seizure Hx of dementia and previous stroke Plan: -neuro IR and stroke following; appreciate recs -repeat imaging per stroke -increase hypertonic saline for na goal 150-155; frequent na checks -spot EEG negative for seizure still on propofol this am; will try to wean off prop and monitor for seizure activity -AEDs per neuro -seizure precautions -frequent neuro checks -limit sedating meds -SBP goal 120-160 per neuro; cont clevi/levo -statin, APT per neuro -when to resume AC per stroke -Continue neuroprotective measures- normothermia, euglycemia, HOB greater than 30, head in neutral alignment, normocapnia, normoxia -f/u echo -pt/ot/slp when appropriate  Acute respiratory failure: intubated for airway protection due to poor ams Plan: -on PSV trial 14/5 doing well; not awake following commands and weak cough/gag; likely hold on extubation today -rest on prvc LTVV strategy with tidal volumes of 6-8 cc/kg ideal body weight  -Wean PEEP/FiO2  for SpO2 >92% -VAP bundle in place -Daily SAT and SBT when appropriate -PAD protocol in place -wean sedation for RASS goal 0 to -1  AKI LA: possibly seizure related? Plan: -trend LA -Trend BMP / urinary output -Replace electrolytes as indicated -Avoid nephrotoxic agents, ensure adequate renal perfusion  Afib CAD HTN MVR w/ mechanical heart valve on coumadin Moderate TVR: seen on echo 10/1 Plan: -APT and when to resume AC per  stroke -clevi/levo for sbp goal as above  Best Practice (right click and Reselect all SmartList Selections daily)   Diet/type: tubefeeds DVT prophylaxis: SCD GI prophylaxis: H2B Lines: N/A Foley:  Yes, and it is still needed Code Status:  full code Last date of multidisciplinary goals of care discussion [per primary]   Labs   CBC: Recent Labs  Lab 03/14/24 1028 03/14/24 1038 03/14/24 1039 03/14/24 1126 03/15/24 0559  WBC 7.8  --   --   --  9.0  NEUTROABS 6.0  --   --   --   --   HGB 10.0* 10.9* 10.9* 9.5* 10.5*  HCT 31.9* 32.0* 32.0* 28.0* 34.1*  MCV 106.7*  --   --   --  110.0*  PLT 167  --   --   --  153    Basic Metabolic Panel: Recent Labs  Lab 03/14/24 1028 03/14/24 1038 03/14/24 1039 03/14/24 1126 03/14/24 1733 03/14/24 2326 03/15/24 0559  NA 137 139 138 139 139 141 144  K 4.2 4.4 4.5 3.8  --   --  4.9  CL 101 105  --   --   --   --  114*  CO2 23  --   --   --   --   --  18*  GLUCOSE 164* 170*  --   --   --   --  124*  BUN 37* 36*  --   --   --   --  30*  CREATININE 2.21* 2.30*  --   --   --   --  2.11*  CALCIUM 9.2  --   --   --   --   --  8.8*   GFR: Estimated Creatinine Clearance: 15.8 mL/min (A) (by C-G formula based on SCr of 2.11 mg/dL (H)). Recent Labs  Lab 03/14/24 1028 03/14/24 1039 03/14/24 1515 03/14/24 1733 03/15/24 0559  WBC 7.8  --   --   --  9.0  LATICACIDVEN  --  2.2* 2.2* 2.9*  --     Liver Function Tests: Recent Labs  Lab 03/14/24 1028  AST 31  ALT 18  ALKPHOS 68  BILITOT 0.9  PROT 7.2  ALBUMIN 4.2   No results for input(s): LIPASE, AMYLASE in the last 168 hours. No results for input(s): AMMONIA in the last 168 hours.  ABG    Component Value Date/Time   PHART 7.425 03/14/2024 1126   PCO2ART 34.5 03/14/2024 1126   PO2ART 505 (H) 03/14/2024 1126   HCO3 23.0 03/14/2024 1126   TCO2 24 03/14/2024 1126   ACIDBASEDEF 2.0 03/14/2024 1126   O2SAT 100 03/14/2024 1126     Coagulation Profile: Recent Labs   Lab 03/14/24 1028 03/15/24 0559  INR 2.3* 2.2*    Cardiac Enzymes: Recent Labs  Lab 03/14/24 1515  CKTOTAL 122    HbA1C: Hgb A1c MFr Bld  Date/Time Value Ref Range Status  03/14/2024 03:15 PM 5.2 4.8 - 5.6 % Final    Comment:    (NOTE) Diagnosis of Diabetes The following  HbA1c ranges recommended by the American Diabetes Association (ADA) may be used as an aid in the diagnosis of diabetes mellitus.  Hemoglobin             Suggested A1C NGSP%              Diagnosis  <5.7                   Non Diabetic  5.7-6.4                Pre-Diabetic  >6.4                   Diabetic  <7.0                   Glycemic control for                       adults with diabetes.      CBG: Recent Labs  Lab 03/14/24 1517 03/14/24 1951 03/15/24 0030 03/15/24 0355 03/15/24 0723  GLUCAP 99 105* 126* 114* 117*    Review of Systems:   Patient is intubated; therefore, history has been obtained from chart review.    Past Medical History:  She,  has a past medical history of CAD (coronary artery disease), Hypertension, and Stroke (HCC).   Surgical History:   Past Surgical History:  Procedure Laterality Date   IR ANGIO VERTEBRAL SEL SUBCLAVIAN INNOMINATE UNI L MOD SED  03/14/2024   IR ANGIO VERTEBRAL SEL VERTEBRAL UNI R MOD SED  03/14/2024   IR CT HEAD LTD  03/14/2024   IR PERCUTANEOUS ART THROMBECTOMY/INFUSION INTRACRANIAL INC DIAG ANGIO  03/14/2024     Social History:   reports that she has quit smoking. Her smoking use included cigarettes. She has never used smokeless tobacco. She reports that she does not drink alcohol and does not use drugs.   Family History:  Her family history is not on file.   Allergies No Known Allergies   Home Medications  Prior to Admission medications   Not on File     Critical care time: 45 minutes     JD Emilio RIGGERS [facility] 03/15/2024, 8:39 AM  Please see [email or web address] for pager details.  From 7A-7P if no  response, please call [phone]. After hours, please call ELink [phone].

## 2024-03-15 NOTE — CV Procedure (Signed)
    TRANSESOPHAGEAL ECHOCARDIOGRAM   NAME:  Miranda Parsons   MRN: [id number] DOB:  01/22/1936   ADMIT DATE: 03/14/2024  INDICATIONS:  Stroke  PROCEDURE:   Informed consent was obtained from family prior to the procedure. The risks, benefits and alternatives for the procedure were discussed and the patient comprehended these risks.  Risks include, but are not limited to, cough, sore throat, vomiting, nausea, somnolence, esophageal and stomach trauma or perforation, bleeding, low blood pressure, aspiration, pneumonia, infection, trauma to the teeth and death.    The patient was on the ventilator. After a procedural time-out, the patient was further sedated. Once an appropriate level of sedation was achieved, the transesophageal probe was inserted in the esophagus and stomach without difficulty and multiple views were obtained.    COMPLICATIONS:    There were no immediate complications.  FINDINGS:  LEFT VENTRICLE: EF = 55-60%. No regional wall motion abnormalities. + LVH  RIGHT VENTRICLE: Normal size and function.   LEFT ATRIUM: Massively dilated. Very heavy smoke but no formed clot  LEFT ATRIAL APPENDAGE:  Very heavy smoke but no formed clot  RIGHT ATRIUM: Severely dilated  AORTIC VALVE:  Trileaflet. Mildly calcified with focal nodular calcification on RCC. Trivial AI. No vegetation  MITRAL VALVE:  Mechanical MVR in place functioning well. No significant MR or MS  TRICUSPID VALVE: Normal. Mild TR No vegetation  PULMONIC VALVE: Grossly normal. Trivial PI No vegetation  INTERATRIAL SEPTUM: No PFO or ASD.  PERICARDIUM: No effusion  DESCENDING AORTA: Very heavy plaque   CONCLUSION:  No TEE evidence of endocarditis  Toribio Cherrie COME 3:54 PM

## 2024-03-15 NOTE — Progress Notes (Signed)
 OT Cancellation Note  Patient Details Name: KYAH BUESING MRN: [id number] DOB: 1935-10-07   Cancelled Treatment:    Reason Eval/Treat Not Completed: Patient not medically ready (Pt currently intubated with RN requesting OT check back later as time permits as plan may be to extubate today.)  Lucie JONETTA Kendall 03/15/2024, 11:29 AM

## 2024-03-15 NOTE — TOC CAGE-AID Note (Signed)
 Transition of Care [facility]) - CAGE-AID Screening   Patient Details  Name: Miranda Parsons MRN: [id number] Date of Birth: 07-27-1935  Transition of Care [facility] CM/SW Contact:    Inocente GORMAN Kindle, LCSW Phone Number: 03/15/2024, 10:06 AM   Clinical Narrative: Patient intubated and unable to participate in screening.    CAGE-AID Screening: Substance Abuse Screening unable to be completed due to: : Patient unable to participate

## 2024-03-15 NOTE — Progress Notes (Signed)
 SLP Cancellation Note  Patient Details Name: Miranda Parsons MRN: [id number] DOB: [birth year]   Cancelled treatment:       Reason Eval/Treat Not Completed: Medical issues which prohibited therapy- on ventilator. Will follow for readiness.  Miranda Parsons L. Vona, MA CCC/SLP Clinical Specialist - Acute Care SLP Acute Rehabilitation Services Office number [phone]    Miranda Parsons 03/15/2024, 7:48 AM

## 2024-03-15 NOTE — Progress Notes (Signed)
 Initial Nutrition Assessment  DOCUMENTATION CODES:   Non-severe (moderate) malnutrition in context of chronic illness  INTERVENTION:  Initiate tube feeding via OG: Osmolite 1.5 at 40 ml/h (960 ml per day) Provides 1440 kcal, 60 gm protein, 731 ml free water daily  Possible Cortrak tomorrow to switch out OG if pt does not do well once extubated with swallow eval  NUTRITION DIAGNOSIS:   Moderate Malnutrition related to chronic illness as evidenced by mild fat depletion, moderate fat depletion, moderate muscle depletion.  GOAL:   Patient will meet greater than or equal to 90% of their needs  MONITOR:   TF tolerance, Vent status, Labs  REASON FOR ASSESSMENT:   Consult Enteral/tube feeding initiation and management, Assessment of nutrition requirement/status  ASSESSMENT:   Pt with hx of CAD, HTN, mechanical heart valve, CVA, and dementia. Admitted after being found unresponsive, code stroke CT showed L ICA occlusion.  10/1 admitted; intubated; s/p thrombectomy  Consult to initiate enteral nutrition via OG. Pt on PSV trial doing well, but will not be extubated today. Spoke with RN who reports plan for extubation tomorrow.   No family present at bedside during assessment. Unable to obtain diet/wt hx. Nutrition focused physical exam shows mild to moderate fat depletions and moderate muscle depletions. Suspect related to malnutrition in the context of chronic illness. Pt is at risk for refeeding, added thiamine 100 mg daily and will titrate tube feeds slowly.   Patient is currently intubated on ventilator support MV: 8.3 L/min Temp (24hrs), Avg:98.1 F (36.7 C), Min:93.2 F (34 C), Max:99.7 F (37.6 C) MAP (cuff): 122 mmHg  Propofol: 4.68 ml/hr *provides additional 123 kcal per day*  Admit weight: 52.2 kg    Intake/Output Summary (Last 24 hours) at 03/15/2024 1210 Last data filed at 03/15/2024 1200 Gross per 24 hour  Intake 2368.58 ml  Output 1465 ml  Net 903.58 ml    Net IO Since Admission: 903.58 mL [03/15/24 1210]  Drains/Lines: OG gastric per xray Urethral Catheter: UOP 1150 x 24 hr   Nutritionally Relevant Medications: Scheduled Meds:  docusate  100 mg Per Tube BID   famotidine  10 mg Per Tube Daily   polyethylene glycol  17 g Per Tube Daily   thiamine  100 mg Per Tube Daily   Continuous Infusions:  feeding supplement (OSMOLITE 1.5 CAL)     norepinephrine (LEVOPHED) Adult infusion 5 mcg/min (03/15/24 1000)   propofol (DIPRIVAN) infusion 15 mcg/kg/min (03/15/24 1000)   sodium chloride (hypertonic) 75 mL/hr at 03/15/24 1126   PRN Meds:.senna-docusate, sodium chloride  Labs Reviewed: CBG ranges from 99-126 mg/dL over the last 24 hours HgbA1c 5.2  NUTRITION - FOCUSED PHYSICAL EXAM:  Flowsheet Row Most Recent Value  Orbital Region Unable to assess  Upper Arm Region Mild depletion  Thoracic and Lumbar Region Moderate depletion  Buccal Region Unable to assess  Temple Region Moderate depletion  Clavicle Bone Region Moderate depletion  Clavicle and Acromion Bone Region Moderate depletion  Scapular Bone Region Moderate depletion  Dorsal Hand Moderate depletion  Patellar Region Unable to assess  Anterior Thigh Region Unable to assess  Posterior Calf Region Unable to assess  Edema (RD Assessment) Mild  [BLE edema, non pitting]  Hair Reviewed  Eyes Unable to assess  Mouth Unable to assess  Skin Reviewed  Nails Reviewed    Diet Order:   Diet Order             Diet NPO time specified  Diet effective now  EDUCATION NEEDS:   Not appropriate for education at this time  Skin:  Skin Assessment: Reviewed RN Assessment  Last BM:  PTA  Height:   Ht Readings from Last 1 Encounters:  03/14/24 5' 5 (1.651 m)    Weight:   Wt Readings from Last 1 Encounters:  03/14/24 52.2 kg    Ideal Body Weight:  56.8 kg  BMI:  Body mass index is 19.14 kg/m.  Estimated Nutritional Needs:   Kcal:   1300-1500  Protein:  50-65g  Fluid:  1.3-1.5L    Josette Glance, MS, RDN, LDN Clinical Dietitian I Please reach out via secure chat

## 2024-03-15 NOTE — Plan of Care (Signed)
  Problem: Ischemic Stroke/TIA Tissue Perfusion: Goal: Complications of ischemic stroke/TIA will be minimized Outcome: Progressing   Problem: Health Behavior/Discharge Planning: Goal: Goals will be collaboratively established with patient/family Outcome: Progressing   Problem: Nutrition: Goal: Risk of aspiration will decrease Outcome: Progressing Goal: Dietary intake will improve Outcome: Progressing   

## 2024-03-16 ENCOUNTER — Inpatient Hospital Stay (HOSPITAL_COMMUNITY)

## 2024-03-16 DIAGNOSIS — I6522 Occlusion and stenosis of left carotid artery: Secondary | ICD-10-CM

## 2024-03-16 DIAGNOSIS — I63412 Cerebral infarction due to embolism of left middle cerebral artery: Secondary | ICD-10-CM

## 2024-03-16 DIAGNOSIS — R791 Abnormal coagulation profile: Secondary | ICD-10-CM

## 2024-03-16 DIAGNOSIS — I610 Nontraumatic intracerebral hemorrhage in hemisphere, subcortical: Secondary | ICD-10-CM

## 2024-03-16 DIAGNOSIS — R29723 NIHSS score 23: Secondary | ICD-10-CM

## 2024-03-16 DIAGNOSIS — R930 Abnormal findings on diagnostic imaging of skull and head, not elsewhere classified: Secondary | ICD-10-CM | POA: Diagnosis not present

## 2024-03-16 LAB — PROTIME-INR
INR: 2.9 — ABNORMAL HIGH (ref 0.8–1.2)
Prothrombin Time: 31.7 s — ABNORMAL HIGH (ref 11.4–15.2)

## 2024-03-16 LAB — GLUCOSE, CAPILLARY
Glucose-Capillary: 107 mg/dL — ABNORMAL HIGH (ref 70–99)
Glucose-Capillary: 121 mg/dL — ABNORMAL HIGH (ref 70–99)
Glucose-Capillary: 149 mg/dL — ABNORMAL HIGH (ref 70–99)
Glucose-Capillary: 149 mg/dL — ABNORMAL HIGH (ref 70–99)
Glucose-Capillary: 158 mg/dL — ABNORMAL HIGH (ref 70–99)
Glucose-Capillary: 94 mg/dL (ref 70–99)

## 2024-03-16 LAB — CBC
HCT: 29.4 % — ABNORMAL LOW (ref 36.0–46.0)
Hemoglobin: 9 g/dL — ABNORMAL LOW (ref 12.0–15.0)
MCH: 33.8 pg (ref 26.0–34.0)
MCHC: 30.6 g/dL (ref 30.0–36.0)
MCV: 110.5 fL — ABNORMAL HIGH (ref 80.0–100.0)
Platelets: 132 K/uL — ABNORMAL LOW (ref 150–400)
RBC: 2.66 MIL/uL — ABNORMAL LOW (ref 3.87–5.11)
RDW: 12.5 % (ref 11.5–15.5)
WBC: 9.2 K/uL (ref 4.0–10.5)
nRBC: 0 % (ref 0.0–0.2)

## 2024-03-16 LAB — BASIC METABOLIC PANEL WITH GFR
BUN: 26 mg/dL — ABNORMAL HIGH (ref 8–23)
BUN: 25 mg/dL — ABNORMAL HIGH (ref 8–23)
BUN: 27 mg/dL — ABNORMAL HIGH (ref 8–23)
CO2: 20 mmol/L — ABNORMAL LOW (ref 22–32)
CO2: 21 mmol/L — ABNORMAL LOW (ref 22–32)
CO2: 21 mmol/L — ABNORMAL LOW (ref 22–32)
Calcium: 8.5 mg/dL — ABNORMAL LOW (ref 8.9–10.3)
Calcium: 8.5 mg/dL — ABNORMAL LOW (ref 8.9–10.3)
Calcium: 8.4 mg/dL — ABNORMAL LOW (ref 8.9–10.3)
Chloride: >130 mmol/L (ref 98–111)
Chloride: >130 mmol/L (ref 98–111)
Chloride: >130 mmol/L (ref 98–111)
Creatinine, Ser: 1.75 mg/dL — ABNORMAL HIGH (ref 0.44–1.00)
Creatinine, Ser: 1.71 mg/dL — ABNORMAL HIGH (ref 0.44–1.00)
Creatinine, Ser: 1.84 mg/dL — ABNORMAL HIGH (ref 0.44–1.00)
GFR, Estimated: 28 mL/min — ABNORMAL LOW (ref >60)
GFR, Estimated: 29 mL/min — ABNORMAL LOW (ref >60)
GFR, Estimated: 26 mL/min — ABNORMAL LOW (ref >60)
Glucose, Bld: 134 mg/dL — ABNORMAL HIGH (ref 70–99)
Glucose, Bld: 147 mg/dL — ABNORMAL HIGH (ref 70–99)
Glucose, Bld: 150 mg/dL — ABNORMAL HIGH (ref 70–99)
Potassium: 4.5 mmol/L (ref 3.5–5.1)
Potassium: 4 mmol/L (ref 3.5–5.1)
Potassium: 3.9 mmol/L (ref 3.5–5.1)
Sodium: 163 mmol/L (ref 135–145)
Sodium: 164 mmol/L (ref 135–145)
Sodium: 163 mmol/L (ref 135–145)

## 2024-03-16 LAB — SODIUM: Sodium: 157 mmol/L — ABNORMAL HIGH (ref 135–145)

## 2024-03-16 LAB — MAGNESIUM: Magnesium: 2 mg/dL (ref 1.7–2.4)

## 2024-03-16 LAB — PHOSPHORUS: Phosphorus: 3 mg/dL (ref 2.5–4.6)

## 2024-03-16 LAB — LACTIC ACID, PLASMA: Lactic Acid, Venous: 2 mmol/L (ref 0.5–1.9)

## 2024-03-16 MED ORDER — LEVETIRACETAM 500 MG PO TABS
500.0000 mg | ORAL_TABLET | Freq: Two times a day (BID) | ORAL | Status: DC
  Administered 2024-03-16 – 2024-03-21 (×10): 500 mg
  Filled 2024-03-16 (×10): qty 1

## 2024-03-16 NOTE — Progress Notes (Addendum)
 STROKE TEAM PROGRESS NOTE    SIGNIFICANT HOSPITAL EVENTS 10/1: Patient found down at home, unresponsive with left gaze.   NIH 38 on admission.   CTA showed left ICA occlusion Underwent successful left ICA thrombectomy  INTERIM HISTORY/SUBJECTIVE Family at the bedside. Patient remains intubated on no sedation.  She is unresponsive Exam with not following commands, Semipurposeful left, Right leg withdraws  Left gaze  Last NA 163  CT head with  Increased intraventricular blood within the left lateral ventricle atrium and occipital horn. No hydrocephalus; Decreasing density of the area of suspected parenchymal contrast staining in the left basal ganglia, with unchanged mass effect on the left lateral ventricle with minimal rightward midline shift;  Hyperdensity at the inferior aspect of the left lentiform nucleus is Hemorrhage  CBC    Component Value Date/Time   WBC 9.2 03/16/2024 0559   RBC 2.66 (L) 03/16/2024 0559   HGB 9.0 (L) 03/16/2024 0559   HCT 29.4 (L) 03/16/2024 0559   PLT 132 (L) 03/16/2024 0559   MCV 110.5 (H) 03/16/2024 0559   MCH 33.8 03/16/2024 0559   MCHC 30.6 03/16/2024 0559   RDW 12.5 03/16/2024 0559   LYMPHSABS 1.1 03/14/2024 1028   MONOABS 0.6 03/14/2024 1028   EOSABS 0.0 03/14/2024 1028   BASOSABS 0.0 03/14/2024 1028    BMET    Component Value Date/Time   NA 163 (HH) 03/16/2024 0559   K 4.5 03/16/2024 0559   CL >130 (HH) 03/16/2024 0559   CO2 20 (L) 03/16/2024 0559   GLUCOSE 134 (H) 03/16/2024 0559   BUN 26 (H) 03/16/2024 0559   CREATININE 1.75 (H) 03/16/2024 0559   CALCIUM 8.5 (L) 03/16/2024 0559   GFRNONAA 28 (L) 03/16/2024 0559    IMAGING past 24 hours CT HEAD WO CONTRAST ( ) Result Date: 03/16/2024 EXAM: CT HEAD WITHOUT CONTRAST 03/16/2024 01:09:28 AM TECHNIQUE: CT of the head was performed without the administration of intravenous contrast. Automated exposure control, iterative reconstruction, and/or weight based adjustment of the mA/kV was  utilized to reduce the radiation dose to as low as reasonably achievable. COMPARISON: 03/14/2024 CLINICAL HISTORY: Stroke, follow up. FINDINGS: BRAIN AND VENTRICLES: Decreasing hyperdensity in the left basal ganglia compared to 03/14/2024. There is a new hyperdense focus near the inferior aspect of the left lentiform nucleus since the 10/01 CT but it appears unchanged compared to the 10/01 MRI. There is persistent mass effect on the left lateral ventricle with minimal rightward midline shift. Blood within the left lateral ventricle atrium and occipital horn has increased. Chronic ischemic white matter changes and generalized volume loss. No evidence of acute infarct. No hydrocephalus. No extra-axial collection. ORBITS: No acute abnormality. SINUSES: No acute abnormality. SOFT TISSUES AND SKULL: No acute soft tissue abnormality. No skull fracture. IMPRESSION: 1. Increased intraventricular blood within the left lateral ventricle atrium and occipital horn. No hydrocephalus. 2. Decreasing density of the area of suspected parenchymal contrast staining in the left basal ganglia, with unchanged mass effect on the left lateral ventricle with minimal rightward midline shift. 3. Hyperdensity at the inferior aspect of the left lentiform nucleus is hemorrhage that is new compared to the 10/1 CT, but unchanged compared to the MRI later that day. Electronically signed by: Miranda Stanford MD 03/16/2024 01:20 AM EDT RP Workstation: [id number]   DG CHEST PORT 1 VIEW Result Date: 03/15/2024 CLINICAL DATA:  Stroke. EXAM: PORTABLE CHEST 1 VIEW COMPARISON:  03/14/2024 FINDINGS: The endotracheal tube terminates approximately 2.0 cm above the carina. The enteric tube extends below  the diaphragm, and is incompletely visualized. Unchanged sternotomy wires. The lungs are well aerated and expanded. No pleural effusion or pneumothorax. Unchanged enlargement of the cardiac silhouette. Prosthetic cardiac valve. IMPRESSION: No significant  interval change. Electronically Signed   By: Miranda Parsons M.D.   On: 03/15/2024 17:02    Vitals:   03/16/24 1000 03/16/24 1100 03/16/24 1200 03/16/24 1315  BP: (!) 129/46 (!) 121/52 121/85   Pulse: 74 68 79 79  Resp: 12 15 19    Temp: 98.6 F (37 C) 98.8 F (37.1 C) 99 F (37.2 C)   TempSrc:      SpO2: 100% 100% 100%   Weight:      Height:         PHYSICAL EXAM General: Acutely ill patient, intubated CV: Sinus arrhythmias on monitor Respiratory: Intubated and mechanically ventilated    NEURO:  Mental Status: Patient is intubated on no sedation She does not open eyes to voice or to noxious stimuli.  She does not follow commands.   Cranial Nerves:  II: PERRL. No blink to threat bilaterally.   III, IV, VI: Does not open eyes, does not track examiner with passive eye raise.  Left gaze Corneal reflex intact IX and X: Cough and gag reflex intact   Motor/Sensory: Minimal movement of right upper extremity Semi purposeful on left, RLE withdrawal  Tone: is normal and bulk is normal Coordination: Able to assess as patient does not follow commands Gait- deferred   Most Recent NIH 23   ASSESSMENT/PLAN   Miranda Parsons is a 89 y.o. female with a PMH of CAD, HTN, mechanical heart valve on Coumadin and stroke who was found down unresponsive on the floor at home by husband with unknown downtime. He last saw her at her baseline last night - husband has dementia and was unsure of time.  She did speak with her family on the phone at about 9 or 10 PM last night.  EMS was called around 0940. The patient was unresponsive and gurgling upon EMS arrival. She was being bagged on arrival to the ED and continued to be unresponsive with left gaze deviation. Shortly after arrival to the ED, the patient was noted to be seizing, with eye gaze to the left.  Patient was intubated for airway protection. Admission head CT revealed hyperdense left ICA, and code stroke was activated given symptoms and  possible LVO.  Left ICA occlusion was noted on CT angiogram with favorable perfusion scan showing large territory hypoperfusion without core infarct.taken for emergent mechanical thrombectomy, admitted for post procedure care, ICU monitoring and further stroke workup. NIH on Admission 33.   Stroke:  left MCA territory and left cerebellum infarcts with left ICA and MCA occlusion s/p IR with TICI 3 revascularization, etiology: mechanical MV with subtherapeutic INR CT head Hyperdense Left ICA terminus and MCA, with fairly widespread cytotoxic edema in the Left MCA territory. ASPECTS 5.  CTA head & neck Positive for Left ICA occlusion at the skull base/siphon. Left ICA T-occlusion appearance intracranially, minimal reconstitution of the left MCA and left ACA A1. CT perfusion 0/179 cc with pseudonormalization S/p IR with TICI 3:03 passes MRI  Multifocal acute/early subacute ischemia within the left MCA territory and left cerebellum with petechial hemorrhage. Mass effect on the left lateral ventricle and 4 mm of rightward midline shift. 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  Repeat CT head 10/3 Increased intraventricular blood within the left lateral ventricle atrium and occipital horn. No hydrocephalus; Decreasing density of  the area of suspected parenchymal contrast staining in the left basal ganglia, with unchanged mass effect on the left lateral ventricle with minimal rightward midline shift;  Hyperdensity at the inferior aspect of the left lentiform nucleus is Hemorrhage Routine EEG suggestive of moderate to severe diffuse encephalopathy.    2D Echo: EF 60-65%, MV replacement, ?vegetation to left aortic valve TEE done at bedside no AV vegetation LDL 53 HgbA1c 5.2 VTE prophylaxis - SCDs today No antithrombotic prior to admission, now on aspirin 81 mg daily only due to hemorrhage Therapy recommendations:  Pending Disposition:  pending   Acute Respiratory Failure, remains intubated  post-procedure Ccm management, appreciate assistance Continue Propofol and Fentanyl Okay to wean sedation as tolerated   Mechanical MV Status post mechanical mitral valve placement On Coumadin at home INR 2.3-> 2.2, subtherapeutic (INR goal at home 2.5-3.5) Currently on aspirin 81  transition to heparin IV when appropriate    Witnessed Seizure Witnessed seizure in ED with left gaze preference.  Intubated for airway protection immediately after. 2 g Keppra Routine EEG negative 10/1 Continue Keppra 500 twice daily   Cerbral Edema Mass Effect 3% hypertonic Saline currently off  Na checks Q6H Goal Na 150-155 Na: [id number]   Hx of Stroke/TIA Dementia Multiple old bilateral cerebellar infarcts and multifocal hyperintense 212 weighted signal seen on this admission's MRI   Hx of Hypertension Hypotension now, improved  Home meds: Atenolol, Lasix, losartan, spironolactone Unstable on the low end Levophed currently off  Blood Pressure Goal: SBP 120-160 for first 24 hours then less than 180  Long-term BP goal normotensive   Lipid management Home meds:  none LDL 53, goal < 70 High intensity statin not indicated d/t LDL within goal on no treatment   Dysphagia Patient has post-stroke dysphagia, SLP consulted N.p.o. On IV fluid   Other Stroke Risk Factors Advanced Age Coronary artery disease Former smoker   Other Active Problems CKD 3B Cr 2.21->2.30->2.11-1.75 on IVF   Hospital day # 2   Miranda Geralds DNP, ACNPC-AG  [facility]  I have personally obtained history,examined this patient, reviewed notes, independently viewed imaging studies, participated in medical decision making and plan of care.ROS completed by me personally and pertinent positives fully documented  I have made any additions or clarifications directly to the above note. Agree with note above.  Patient with mechanical heart valve on some optimal anticoagulation with presented  with left carotid and MCA occlusion and underwent emergent mechanical thrombectomy with good revascularization but follow-up imaging shows hemorrhagic transformation and large left basal ganglia infarct.  We are between a rock and a hard place as patient needs anticoagulation for her mechanical heart valve but because of hemorrhagic transformation we may have to hold off on anticoagulation for now which puts her at increased risk for recurrent thromboembolism and strokes.  Patient neurological exam is quite poor and she remains aphasic not following commands.  Prognosis is guarded.  Long discussion at the bedside with the patient's husband, son and grandson and answered questions.  Family is realistic but would like to continue support for the next couple of days and if there is no meaningful improvement we may consider withdrawal of care.  They agree to DNR.  Discussed with Dr. Harold critical care medicine. This patient is critically ill and at significant risk of neurological worsening, death and care requires constant monitoring of vital signs, hemodynamics,respiratory and cardiac monitoring, extensive review of multiple databases, frequent neurological assessment, discussion with family, other specialists and medical decision  making of high complexity.I have made any additions or clarifications directly to the above note.This critical care time does not reflect procedure time, or teaching time or supervisory time of PA/NP/Med Resident etc but could involve care discussion time.  I spent 50 minutes of neurocritical care time  in the care of  this patient.     Miranda Popp, MD Medical Director [facility] Pager: [phone] 03/16/2024 4:01 PM    To contact Stroke Continuity provider, please refer to [email or web address]. After hours, contact General Neurology

## 2024-03-16 NOTE — Progress Notes (Signed)
 NAME:  Miranda Parsons, MRN:  [id number], DOB:  March 15, 1936, LOS: 2 ADMISSION DATE:  03/14/2024, CONSULTATION DATE:  10/1 REFERRING MD:  Dr. Ray, CHIEF COMPLAINT:  L ICA stroke s/p thrombectomy   History of Present Illness:  Patient is a 89 yo F w/ pertinent PMH CAD, HTN, mechanical heart valve on coumadin, dementia, previous stroke brought to [facility] ED on 10/1 code stroke.  On 10/1 patient found down by husband in am, unresponsive on floor. LKN last night 9-10 pm on 9/30. EMS called. Unresponsive w/ left gaze deviation. Bp 150/70 and cbg 140. Patient bagged en route to [facility] ED. On arrival patient with possible seizure w/ left gaze deviation. Given versed and loaded w/ keppra. Intubated for airway protection. CT head showing hyperdense L ICA. CTA head L ICA occlusion. Patient taken to IR for thrombectomy post TICI 3 revascularization. Post op intubated/sedated and brought to 4N icu.    Pertinent  Medical History   Past Medical History:  Diagnosis Date   CAD (coronary artery disease)    Hypertension    Stroke (HCC)      Significant Hospital Events: Including procedures, antibiotic start and stop dates in addition to other pertinent events   10/1 admit L ICA stroke s/p thrombectomy  Interim History / Subjective:  TEE with no evidence of endocarditis +3.1 L I/O with net + 3.7 L this admission On 3% infusion per Neurology- sodium elevated and have stopped the infusion Off sedation since yesterday, levo weaned off with sedation wean CTH overnight  Objective    Blood pressure (!) 137/54, pulse 72, temperature 98.8 F (37.1 C), resp. rate 13, height 5' 5 (1.651 m), weight 63.1 kg, SpO2 100%.    Vent Mode: CPAP;PSV FiO2 (%):  [40 %] 40 % Set Rate:  [18 bmp] 18 bmp Vt Set:  [40 mL-450 mL] 450 mL PEEP:  [5 cmH20] 5 cmH20 Pressure Support:  [12 cmH20] 12 cmH20 Plateau Pressure:  [16 cmH20] 16 cmH20   Intake/Output Summary (Last 24 hours) at 03/16/2024 0950 Last data filed at  03/16/2024 0900 Gross per 24 hour  Intake 4102.93 ml  Output 810 ml  Net 3292.93 ml   Filed Weights   03/14/24 1040 03/16/24 0458  Weight: 52.2 kg 63.1 kg    Examination: General:  elderly female intubated, no sedation HEENT: MM pink/moist; ETT in place Neuro: non eye opening, pupils pinpoint, does not follow commands, withdraws LUE, withdraws to BLE L>R CV: s1s2, RRR, no m/r/g PULM:  lungs clear, trace blood tinge secretions via ETT GI: soft, bsx4 active  Extremities: warm/dry, no edema  Skin: no rashes or lesions    Resolved problem list   Assessment and Plan   L ICA stroke s/p thrombectomy TICI 3 Cerebral edema w/ mass effect and rightward midline shift Possible seizure Hx of dementia and previous stroke Plan: -neuro IR and stroke following; appreciate recs -spot EEG 10/2: moderate to severe diffuse encephalopathy  -Hypertonic saline for na goal 150-155; frequent na checks -Sz precautions with AEDs per neuro- Keppra BID -frequent neuro checks -limit sedating meds -SBP goal 120-160 per neuro; cont clevi/levo -statin, APT per neuro -when to resume AC per stroke -Continue neuroprotective measures- normothermia, euglycemia, HOB greater than 30, head in neutral alignment, normocapnia, normoxia -pt/ot/slp when appropriate  Acute respiratory failure: intubated for airway protection due to poor ams Plan: -Rested on PRVC overnight and placed on PSV 12/5 this am at 40% FiO2 -Wean PEEP/FiO2 for SpO2 >92% -VAP bundle in place -  Daily SAT and SBT  -PAD protocol in place -wean sedation for RASS goal 0 to -1 -Neurological exam and inability to protect airway would prevent extubation at this time  AKI, improving Plan: -Trend BMP / urinary output -Replace electrolytes as indicated -Avoid nephrotoxic agents, ensure adequate renal perfusion -Continue IVF, would discontinue if 3% restarted  Afib CAD HTN MVR w/ mechanical heart valve on coumadin Moderate TVR: seen on echo  10/1 Plan: -APT and when to resume AC per stroke  Best Practice (right click and Reselect all SmartList Selections daily)   Diet/type: tubefeeds DVT prophylaxis: SCD GI prophylaxis: H2B Lines: N/A Foley:  Yes, and it is still needed Code Status:  full code Last date of multidisciplinary goals of care discussion [per primary]   Labs   CBC: Recent Labs  Lab 03/14/24 1028 03/14/24 1038 03/14/24 1039 03/14/24 1126 03/15/24 0559 03/16/24 0559  WBC 7.8  --   --   --  9.0 9.2  NEUTROABS 6.0  --   --   --   --   --   HGB 10.0* 10.9* 10.9* 9.5* 10.5* 9.0*  HCT 31.9* 32.0* 32.0* 28.0* 34.1* 29.4*  MCV 106.7*  --   --   --  110.0* 110.5*  PLT 167  --   --   --  153 132*    Basic Metabolic Panel: Recent Labs  Lab 03/14/24 1028 03/14/24 1038 03/14/24 1039 03/14/24 1126 03/14/24 1733 03/15/24 0559 03/15/24 1136 03/15/24 1211 03/15/24 1638 03/15/24 2327 03/16/24 0559  NA 137 139 138 139   < > 144 147*  --  153* 157* 163*  K 4.2 4.4 4.5 3.8  --  4.9  --   --   --   --  4.5  CL 101 105  --   --   --  114*  --   --   --   --  >130*  CO2 23  --   --   --   --  18*  --   --   --   --  20*  GLUCOSE 164* 170*  --   --   --  124*  --   --   --   --  134*  BUN 37* 36*  --   --   --  30*  --   --   --   --  26*  CREATININE 2.21* 2.30*  --   --   --  2.11*  --   --   --   --  1.75*  CALCIUM 9.2  --   --   --   --  8.8*  --   --   --   --  8.5*  MG  --   --   --   --   --   --   --  1.9  --   --  2.0  PHOS  --   --   --   --   --   --   --  3.9  --   --  3.0   < > = values in this interval not displayed.   GFR: Estimated Creatinine Clearance: 20.8 mL/min (A) (by C-G formula based on SCr of 1.75 mg/dL (H)). Recent Labs  Lab 03/14/24 1028 03/14/24 1039 03/14/24 1733 03/15/24 0559 03/15/24 1136 03/15/24 1930 03/16/24 0559  WBC 7.8  --   --  9.0  --   --  9.2  LATICACIDVEN  --    < >  2.9*  --  4.3* 2.3* 2.0*   < > = values in this interval not displayed.    Liver  Function Tests: Recent Labs  Lab 03/14/24 1028  AST 31  ALT 18  ALKPHOS 68  BILITOT 0.9  PROT 7.2  ALBUMIN 4.2   No results for input(s): LIPASE, AMYLASE in the last 168 hours. No results for input(s): AMMONIA in the last 168 hours.  ABG    Component Value Date/Time   PHART 7.425 03/14/2024 1126   PCO2ART 34.5 03/14/2024 1126   PO2ART 505 (H) 03/14/2024 1126   HCO3 23.0 03/14/2024 1126   TCO2 24 03/14/2024 1126   ACIDBASEDEF 2.0 03/14/2024 1126   O2SAT 100 03/14/2024 1126     Coagulation Profile: Recent Labs  Lab 03/14/24 1028 03/15/24 0559 03/16/24 0559  INR 2.3* 2.2* 2.9*    Cardiac Enzymes: Recent Labs  Lab 03/14/24 1515  CKTOTAL 122    HbA1C: Hgb A1c MFr Bld  Date/Time Value Ref Range Status  03/14/2024 03:15 PM 5.2 4.8 - 5.6 % Final    Comment:    (NOTE) Diagnosis of Diabetes The following HbA1c ranges recommended by the American Diabetes Association (ADA) may be used as an aid in the diagnosis of diabetes mellitus.  Hemoglobin             Suggested A1C NGSP%              Diagnosis  <5.7                   Non Diabetic  5.7-6.4                Pre-Diabetic  >6.4                   Diabetic  <7.0                   Glycemic control for                       adults with diabetes.      CBG: Recent Labs  Lab 03/15/24 1522 03/15/24 1944 03/15/24 2349 03/16/24 0351 03/16/24 0713  GLUCAP 124* 142* 124* 121* 149*    Review of Systems:   Patient is intubated; therefore, history has been obtained from chart review.    Past Medical History:  She,  has a past medical history of CAD (coronary artery disease), Hypertension, and Stroke (HCC).   Surgical History:   Past Surgical History:  Procedure Laterality Date   IR ANGIO VERTEBRAL SEL SUBCLAVIAN INNOMINATE UNI L MOD SED  03/14/2024   IR ANGIO VERTEBRAL SEL VERTEBRAL UNI R MOD SED  03/14/2024   IR CT HEAD LTD  03/14/2024   IR PERCUTANEOUS ART THROMBECTOMY/INFUSION INTRACRANIAL INC  DIAG ANGIO  03/14/2024   RADIOLOGY WITH ANESTHESIA N/A 03/14/2024   Procedure: RADIOLOGY WITH ANESTHESIA;  Surgeon: Radiologist, Medication, MD;  Location: [facility] OR;  Service: Radiology;  Laterality: N/A;     Social History:   reports that she has quit smoking. Her smoking use included cigarettes. She has never used smokeless tobacco. She reports that she does not drink alcohol and does not use drugs.   Family History:  Her family history is not on file.   Allergies No Known Allergies   Home Medications  Prior to Admission medications   Not on File     Critical care time: 35 minutes     Lucie Irving, ACNP  [facility] 03/16/2024, 9:50 AM  Please haiku   From 7A-7P if no response, please call [phone]. After hours, please call ELink [phone].

## 2024-03-16 NOTE — TOC CM/SW Note (Signed)
 Transition of Care [facility]) - Inpatient Brief Assessment   Patient Details  Name: Miranda Parsons MRN: [id number] Date of Birth: 04-18-1936  Transition of Care [facility]) CM/SW Contact:    Bolton Canupp M, RN Phone Number: 03/16/2024, 5:03 PM   Clinical Narrative: 89 year old female here with left MCA territory stroke status post left ICA thrombectomy, complicated with cerebral edema and brain compression. Patient with guarded prognosis; family discussing GOC with MD.    Transition of Care Asessment: Insurance and Status: Insurance coverage has been reviewed Patient has primary care physician: Yes Honey Shan) Home environment has been reviewed: Lives with spouse who has dementia Prior level of function:: Independent Prior/Current Home Services: No current home services Social Drivers of Health Review: SDOH reviewed needs interventions Readmission risk has been reviewed: Yes Transition of care needs: transition of care needs identified, TOC will continue to follow  Mliss MICAEL Fass, RN, BSN  Trauma/Neuro ICU Case Manager [phone]

## 2024-03-16 NOTE — Progress Notes (Addendum)
 PT Cancellation Note  Patient Details Name: Miranda Parsons MRN: [id number] DOB: August 26, 1935   Cancelled Treatment:    Reason Eval/Treat Not Completed: Patient not medically ready. Pt intubated and RN reporting plan for palliative meeting to discuss and form GOC today. Will plan to follow-up as able once GOC are established.   Miranda Parsons, PT, DPT Acute Rehabilitation Services  Office: [phone]    Miranda Parsons 03/16/2024, 9:20 AM

## 2024-03-16 NOTE — Progress Notes (Addendum)
 eLink Physician-Brief Progress Note Patient Name: DENIZ ESKRIDGE DOB: 01/01/1936 MRN: [id number]   Date of Service  03/16/2024  HPI/Events of Note  Called about critical lab, NA 163, stable from last 2 readings  3% saline was stopped earlier this morning.  RT suctioned patient and got about 50 cc of bright red blood out.  No TNK.  Eliquis at home.  eICU Interventions  Minimize suction trauma is much as possible, obtain coag studies with morning labs.  No intervention for 163 Na given cerebral edema, may consider free water if trending in the wrong direction, hold 3%     Intervention Category Intermediate Interventions: Electrolyte abnormality - evaluation and management  Noble Cicalese 03/16/2024, 8:49 PM

## 2024-03-16 NOTE — Plan of Care (Signed)
  Problem: Ischemic Stroke/TIA Tissue Perfusion: Goal: Complications of ischemic stroke/TIA will be minimized Outcome: Progressing   Problem: Coping: Goal: Will verbalize positive feelings about self Outcome: Progressing   Problem: Health Behavior/Discharge Planning: Goal: Goals will be collaboratively established with patient/family Outcome: Progressing   Problem: Self-Care: Goal: Ability to communicate needs accurately will improve Outcome: Progressing   Problem: Nutrition: Goal: Risk of aspiration will decrease Outcome: Progressing

## 2024-03-16 NOTE — Progress Notes (Signed)
 Pt transported on ventilator to Ct and back w/o complication.

## 2024-03-17 DIAGNOSIS — E870 Hyperosmolality and hypernatremia: Secondary | ICD-10-CM

## 2024-03-17 DIAGNOSIS — Z7982 Long term (current) use of aspirin: Secondary | ICD-10-CM

## 2024-03-17 DIAGNOSIS — I480 Paroxysmal atrial fibrillation: Secondary | ICD-10-CM

## 2024-03-17 MED ORDER — FREE WATER
200.0000 mL | Status: DC
  Administered 2024-03-17 – 2024-03-18 (×7): 200 mL

## 2024-03-17 MED ORDER — POTASSIUM & SODIUM PHOSPHATES 280-160-250 MG PO PACK
2.0000 | PACK | Freq: Three times a day (TID) | ORAL | Status: AC
  Administered 2024-03-17 (×2): 2
  Filled 2024-03-17 (×2): qty 2

## 2024-03-17 NOTE — Progress Notes (Addendum)
 STROKE TEAM PROGRESS NOTE    SIGNIFICANT HOSPITAL EVENTS 10/1: Patient found down at home, unresponsive with left gaze.   NIH 38 on admission.   CTA showed left ICA occlusion Underwent successful left ICA thrombectomy  INTERIM HISTORY/SUBJECTIVE No family at the bedside.  Patient remains intubated on no sedation.  She is weaning on the ventilator. She is slightly more responsive today she is purposeful on the left withdraws in bilateral lowers with trace withdrawal in right arm Last sodium 165, will start free water flushes 200 cc every 4 hours She had a seizure last night and started on Keppra. CBC    Component Value Date/Time   WBC 9.4 03/17/2024 0600   RBC 2.70 (L) 03/17/2024 0600   HGB 9.1 (L) 03/17/2024 0600   HCT 30.5 (L) 03/17/2024 0600   PLT 142 (L) 03/17/2024 0600   PLT 141 (L) 03/17/2024 0600   MCV 113.0 (H) 03/17/2024 0600   MCH 33.7 03/17/2024 0600   MCHC 29.8 (L) 03/17/2024 0600   RDW 13.0 03/17/2024 0600   LYMPHSABS 1.1 03/14/2024 1028   MONOABS 0.6 03/14/2024 1028   EOSABS 0.0 03/14/2024 1028   BASOSABS 0.0 03/14/2024 1028    BMET    Component Value Date/Time   NA 165 (HH) 03/17/2024 0600   K 4.1 03/17/2024 0600   CL >130 (HH) 03/17/2024 0600   CO2 21 (L) 03/17/2024 0600   GLUCOSE 133 (H) 03/17/2024 0600   BUN 28 (H) 03/17/2024 0600   CREATININE 1.89 (H) 03/17/2024 0600   CALCIUM 8.7 (L) 03/17/2024 0600   GFRNONAA 26 (L) 03/17/2024 0600    IMAGING past 24 hours No results found.   Vitals:   03/17/24 0500 03/17/24 0600 03/17/24 0700 03/17/24 0800  BP: (!) 149/67 (!) 139/49 (!) 153/76 130/69  Pulse: 88 72 75 77  Resp: (!) 22 16 15 20   Temp: 98.8 F (37.1 C) 98.6 F (37 C) 99 F (37.2 C) 99 F (37.2 C)  TempSrc:      SpO2: 100% 100% 100% 100%  Weight:  64.9 kg    Height:         PHYSICAL EXAM General: Acutely ill patient, intubated CV: Sinus arrhythmias on monitor Respiratory: Intubated and mechanically ventilated    NEURO:   Mental Status: Patient is intubated on no sedation She does not open eyes to voice or to noxious stimuli.  She does not follow commands.   Cranial Nerves:  II: PERRL. No blink to threat bilaterally.   III, IV, VI: Does not open eyes, does not track examiner with passive eye raise.  Left gaze Corneal reflex intact IX and X: Cough and gag reflex intact   Motor/Sensory: Minimal movement of right upper extremity Semi purposeful on left, RLE withdrawal  Tone: is normal and bulk is normal Coordination: Able to assess as patient does not follow commands Gait- deferred   Most Recent NIH 23   ASSESSMENT/PLAN   [name] is a 89 y.o. female with a PMH of CAD, HTN, mechanical heart valve on Coumadin and stroke who was found down unresponsive on the floor at home by husband with unknown downtime. He last saw her at her baseline last night - husband has dementia and was unsure of time.  She did speak with her family on the phone at about 9 or 10 PM last night.  EMS was called around 0940. The patient was unresponsive and gurgling upon EMS arrival. She was being bagged on arrival to the  ED and continued to be unresponsive with left gaze deviation. Shortly after arrival to the ED, the patient was noted to be seizing, with eye gaze to the left.  Patient was intubated for airway protection. Admission head CT revealed hyperdense left ICA, and code stroke was activated given symptoms and possible LVO.  Left ICA occlusion was noted on CT angiogram with favorable perfusion scan showing large territory hypoperfusion without core infarct.taken for emergent mechanical thrombectomy, admitted for post procedure care, ICU monitoring and further stroke workup. NIH on Admission 33.   Stroke:  left MCA territory and left cerebellum infarcts with left ICA and MCA occlusion s/p IR with TICI 3 revascularization, etiology: mechanical MV with subtherapeutic INR CT head Hyperdense Left ICA terminus and MCA, with  fairly widespread cytotoxic edema in the Left MCA territory. ASPECTS 5.  CTA head & neck Positive for Left ICA occlusion at the skull base/siphon. Left ICA T-occlusion appearance intracranially, minimal reconstitution of the left MCA and left ACA A1. CT perfusion 0/179 cc with pseudonormalization S/p IR with TICI 3:03 passes MRI  Multifocal acute/early subacute ischemia within the left MCA territory and left cerebellum with petechial hemorrhage. Mass effect on the left lateral ventricle and 4 mm of rightward midline shift. 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  Repeat CT head 10/3 Increased intraventricular blood within the left lateral ventricle atrium and occipital horn. No hydrocephalus; Decreasing density of the area of suspected parenchymal contrast staining in the left basal ganglia, with unchanged mass effect on the left lateral ventricle with minimal rightward midline shift;  Hyperdensity at the inferior aspect of the left lentiform nucleus is Hemorrhage Routine EEG suggestive of moderate to severe diffuse encephalopathy.    2D Echo: EF 60-65%, MV replacement, ?vegetation to left aortic valve TEE done at bedside no AV vegetation LDL 53 HgbA1c 5.2 VTE prophylaxis - SCDs today No antithrombotic prior to admission, now on aspirin 81 mg daily only due to hemorrhage Therapy recommendations:  Pending Disposition:  pending   Acute Respiratory Failure, remains intubated post-procedure Ccm management, appreciate assistance Continue Propofol and Fentanyl Okay to wean sedation as tolerated   Mechanical MV Status post mechanical mitral valve placement On Coumadin at home INR 2.3-> 2.2, subtherapeutic (INR goal at home 2.5-3.5) Currently on aspirin 81  transition to heparin IV when appropriate    Witnessed Seizure Witnessed seizure in ED with left gaze preference.  Intubated for airway protection immediately after. 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 On tube feeds, free water flushes   Other Stroke Risk Factors Advanced Age Coronary artery disease Former smoker   Other Active Problems CKD 3B Cr 2.21->2.30->2.11-1.75 on IVF   Hospital day # 3   Miranda Geralds DNP, ACNPC-AG  [facility]  I have personally obtained history,examined this patient, reviewed notes, independently viewed imaging studies, participated in medical decision making and plan of care.ROS completed by me personally and pertinent positives fully documented  I have made any additions or clarifications directly to the above note. Agree with note above.  Patient remains poorly responsive and intubated for respiratory failure.  She has purposeful movements on the left but this plegic in the right arm.  She remains on anticoagulation but INR is 2.6.  She remains at significant risk for neurological worsening from recurrent thromboembolism due to mechanical heart valve and clearly is not a candidate for anticoagulation at the present time due to hemorrhagic transformation and her current stroke.  Discussion with patient's family members at the bedside including her husband and son about the  goals of Mrs. Answered questions.  Family has agreed to DNR but would like to continue support for a few days to see if she makes any meaningful recovery which would be unlikely.  They are quite clear patient would not want tracheostomy, prolonged ventilatory support or PEG tube or nursing home stay which seems unavoidable.  Discussed with Dr. Ailene critical care medicine This patient is critically ill and at significant risk of neurological worsening, death and care requires constant monitoring of vital signs, hemodynamics,respiratory and cardiac monitoring, extensive review of multiple databases, frequent neurological assessment, discussion with family, other specialists and medical decision making of high complexity.I have made any additions or clarifications directly to the above note.This critical care time does not reflect procedure time, or teaching time or supervisory time of PA/NP/Med Resident etc but could involve care discussion time.  I spent 30 minutes of neurocritical care time  in the care of  this patient.     Miranda Popp, MD Medical Director [facility] Pager: [phone] 03/17/2024 3:12 PM     To contact Stroke Continuity provider, please refer to [email or web address]. After hours, contact General Neurology

## 2024-03-18 ENCOUNTER — Inpatient Hospital Stay (HOSPITAL_COMMUNITY)

## 2024-03-18 DIAGNOSIS — I615 Nontraumatic intracerebral hemorrhage, intraventricular: Secondary | ICD-10-CM

## 2024-03-18 MED ORDER — FREE WATER
300.0000 mL | Status: DC
  Administered 2024-03-18 – 2024-03-20 (×12): 300 mL

## 2024-03-19 ENCOUNTER — Inpatient Hospital Stay (HOSPITAL_COMMUNITY)

## 2024-03-19 MED ORDER — SENNA 8.6 MG PO TABS
2.0000 | ORAL_TABLET | Freq: Every day | ORAL | Status: DC
  Administered 2024-03-19: 17.2 mg
  Filled 2024-03-19: qty 2

## 2024-03-19 MED ORDER — FAMOTIDINE 20 MG PO TABS
10.0000 mg | ORAL_TABLET | Freq: Once | ORAL | Status: AC
  Administered 2024-03-19: 10 mg
  Filled 2024-03-19: qty 1

## 2024-03-19 MED ORDER — BISACODYL 10 MG RE SUPP
10.0000 mg | Freq: Once | RECTAL | Status: AC
  Administered 2024-03-19: 10 mg via RECTAL
  Filled 2024-03-19: qty 1

## 2024-03-19 MED ORDER — POLYETHYLENE GLYCOL 3350 17 G PO PACK
17.0000 g | PACK | Freq: Two times a day (BID) | ORAL | Status: DC
  Administered 2024-03-19: 17 g
  Filled 2024-03-19: qty 1

## 2024-03-19 MED ORDER — FAMOTIDINE 20 MG PO TABS
20.0000 mg | ORAL_TABLET | Freq: Every day | ORAL | Status: DC
  Administered 2024-03-20 – 2024-03-21 (×2): 20 mg
  Filled 2024-03-19 (×2): qty 1

## 2024-03-20 DIAGNOSIS — I63512 Cerebral infarction due to unspecified occlusion or stenosis of left middle cerebral artery: Secondary | ICD-10-CM | POA: Diagnosis not present

## 2024-03-20 DIAGNOSIS — I615 Nontraumatic intracerebral hemorrhage, intraventricular: Secondary | ICD-10-CM | POA: Diagnosis not present

## 2024-03-20 MED ORDER — HEPARIN (PORCINE) 25000 UT/250ML-% IV SOLN
800.0000 [IU]/h | INTRAVENOUS | Status: DC
  Administered 2024-03-20: 650 [IU]/h via INTRAVENOUS
  Filled 2024-03-20: qty 250

## 2024-03-20 MED ORDER — FREE WATER
200.0000 mL | Freq: Four times a day (QID) | Status: DC
  Administered 2024-03-20 – 2024-03-21 (×5): 200 mL

## 2024-03-21 MED ORDER — ACETAMINOPHEN 325 MG PO TABS: 650.0000 mg | ORAL_TABLET | Freq: Four times a day (QID) | ORAL | Status: DC | PRN

## 2024-03-21 MED ORDER — GLYCOPYRROLATE 1 MG PO TABS: 1.0000 mg | ORAL_TABLET | ORAL | Status: DC | PRN

## 2024-03-21 MED ORDER — MORPHINE 100MG IN NS 100ML (1MG/ML) PREMIX INFUSION
0.0000 mg/h | INTRAVENOUS | Status: DC
  Administered 2024-03-21: 20 mg/h via INTRAVENOUS
  Administered 2024-03-21: 5 mg/h via INTRAVENOUS
  Filled 2024-03-21 (×2): qty 100

## 2024-03-21 MED ORDER — GLYCOPYRROLATE 0.2 MG/ML IJ SOLN
0.2000 mg | INTRAMUSCULAR | Status: DC | PRN
  Filled 2024-03-21: qty 1

## 2024-03-21 MED ORDER — ACETAMINOPHEN 650 MG RE SUPP: 650.0000 mg | Freq: Four times a day (QID) | RECTAL | Status: DC | PRN

## 2024-03-21 MED ORDER — GLYCOPYRROLATE 0.2 MG/ML IJ SOLN: 0.2000 mg | INTRAMUSCULAR | Status: DC | PRN

## 2024-03-21 MED ORDER — POLYVINYL ALCOHOL 1.4 % OP SOLN: 1.0000 [drp] | Freq: Four times a day (QID) | OPHTHALMIC | Status: DC | PRN

## 2024-03-21 MED ORDER — MIDAZOLAM HCL 2 MG/2ML IJ SOLN
2.0000 mg | INTRAMUSCULAR | Status: DC | PRN
Start: 2024-03-21 — End: 2024-03-22

## 2024-03-21 MED ORDER — MORPHINE SULFATE (PF) 2 MG/ML IV SOLN
2.0000 mg | INTRAVENOUS | Status: DC | PRN
  Administered 2024-03-21: 4 mg via INTRAVENOUS
  Administered 2024-03-21: 2 mg via INTRAVENOUS
  Filled 2024-03-21 (×2): qty 2

## 2024-03-21 MED ORDER — SODIUM CHLORIDE 0.9 % IV SOLN: INTRAVENOUS | Status: DC

## 2024-03-21 MED ORDER — MORPHINE BOLUS VIA INFUSION: 5.0000 mg | INTRAVENOUS | Status: DC | PRN

## 2024-03-21 MED ORDER — POLYETHYLENE GLYCOL 3350 17 G PO PACK: 17.0000 g | PACK | Freq: Every day | ORAL | Status: DC

## 2024-03-22 ENCOUNTER — Encounter: Payer: Self-pay | Admitting: Physician Assistant

## 2024-03-26 NOTE — Anesthesia Postprocedure Evaluation (Signed)
 Anesthesia Post Note  Patient: Miranda Parsons  Procedure(s) Performed: RADIOLOGY WITH ANESTHESIA     Patient location during evaluation: SICU Anesthesia Type: General Level of consciousness: sedated Pain management: pain level controlled Vital Signs Assessment: post-procedure vital signs reviewed and stable Respiratory status: patient remains intubated per anesthesia plan Cardiovascular status: stable Postop Assessment: no apparent nausea or vomiting Anesthetic complications: no   No notable events documented.  Last Vitals:  Vitals:   04-14-24 2115 04/14/24 2121  BP:    Pulse:    Resp: (!) 5   Temp: 37.3 C 37.6 C  SpO2:      Last Pain:  Vitals:   14-Apr-2024 2000  TempSrc: Bladder  PainSc:                  Epifanio Lamar BRAVO

## 2024-04-14 NOTE — Death Summary Note (Addendum)
 DEATH SUMMARY   Patient Details  Name: ICESIS RENN MRN: [id number] DOB: 06/18/1935  Admission/Discharge Information   Admit Date:  04/03/2024  Date of Death: Date of Death: 28-Mar-2024  Time of Death: Time of Death: Apr 14, 2120  Length of Stay: 8  Referring Physician: Yolande Toribio MATSU, MD   Reason(s) for Hospitalization  Stroke: left MCA territory and left cerebellum infarcts with left ICA and MCA occlusion s/p IR with TICI 3 revascularization, etiology: mechanical MV with subtherapeutic INR   Diagnoses  Preliminary cause of death:  Secondary Diagnoses (including complications and co-morbidities):  Principal Problem:   Stroke (cerebrum) (HCC) Active Problems:   Acute respiratory failure (HCC)   Malnutrition of moderate degree Aphasia Cytotoxic edema Subarachnoid hemorrhage hemiplegia   Brief Hospital Course (including significant findings, care, treatment, and services provided and events leading to death)  Danae C Seefeldt is a 89 y.o. year old female with past medical history of CAD, HTN, mechanical heart valve on Coumadin and stroke who was found down unresponsive on the floor at home by husband with unknown downtime. He last saw her at her baseline last night - husband has dementia and was unsure of time. She did speak with her family on the phone at about 9 or 10 PM last night. EMS was called around 0940. The patient was unresponsive and gurgling upon EMS arrival. She was being bagged on arrival to the ED and continued to be unresponsive with left gaze deviation. Shortly after arrival to the ED, the patient was noted to be seizing, with eye gaze to the left. Patient was intubated for airway protection. Admission head CT revealed hyperdense left ICA, and code stroke was activated given symptoms and possible LVO. Left ICA occlusion was noted on CT angiogram with favorable perfusion scan showing large territory hypoperfusion without core infarct.taken for emergent mechanical  thrombectomy, admitted for post procedure care, ICU monitoring and further stroke workup. NIH on Admission 33.   Stroke:  left MCA territory and left cerebellum infarcts with left ICA and MCA occlusion s/p IR with TICI 3 revascularization, etiology: mechanical MV with subtherapeutic INR CT head Hyperdense Left ICA terminus and MCA, with fairly widespread cytotoxic edema in the Left MCA territory. ASPECTS 5.  CTA head & neck Positive for Left ICA occlusion at the skull base/siphon. Left ICA T-occlusion appearance intracranially, minimal reconstitution of the left MCA and left ACA A1. CT perfusion 0/179 cc with pseudonormalization S/p IR with TICI 3:03 passes MRI  Multifocal acute/early subacute ischemia within the left MCA territory and left cerebellum with petechial hemorrhage. Mass effect on the left lateral ventricle and 4 mm of rightward midline shift. 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  Repeat CT head 10/3 Increased intraventricular blood within the left lateral ventricle atrium and occipital horn. No hydrocephalus; Decreasing density of the area of suspected parenchymal contrast staining in the left basal ganglia, with unchanged mass effect on the left lateral ventricle with minimal rightward midline shift;  Hyperdensity at the inferior aspect of the left lentiform nucleus is Hemorrhage Repeat CT head 03/18/2024 shows stable appearance of the intraventricular hemorrhage and parenchymal hemorrhage and cytotoxic edema. Repeat CT head 10/7 stable  Routine EEG suggestive of moderate to severe diffuse encephalopathy.    2D Echo: EF 60-65%, MV replacement, ?vegetation to left aortic valve TEE done at bedside no AV vegetation VTE prophylaxis - SCDs today No antithrombotic prior to admission, now on aspirin 81 mg daily only due to hemorrhage Therapy recommendations:  Pending Disposition:  pending  Acute Respiratory Failure, remains intubated post-procedure Ccm management, appreciate  assistance Continue Propofol and Fentanyl Okay to wean sedation as tolerated   Mechanical MV Status post mechanical mitral valve placement On Coumadin at home INR 2.3-> 2.2, subtherapeutic (INR goal at home 2.5-3.5) Currently on aspirin 81  transition to heparin IV when appropriate    Witnessed Seizure Witnessed seizure in ED with left gaze preference.  Intubated for airway protection immediately after. 2 g Keppra Routine EEG negative 10/1 Continue Keppra 500 twice daily   Cerbral Edema Mass Effect 3% hypertonic Saline currently off  Na checks Q6H Goal Na 150-155 Na: 137-139-141-144-153-157-163-165-161-150-145 Decrease free water flushes to 200 cc q6 hours   Hx of Stroke/TIA Dementia Multiple old bilateral cerebellar infarcts and multifocal hyperintense 212 weighted signal seen on this admission's MRI   Hx of Hypertension Hypotension now, improved  Home meds: Atenolol, Lasix, losartan , spironolactone  Unstable on the low end Levophed currently off  Blood Pressure Goal: SBP 120-160 for first 24 hours then less than 180  Long-term BP goal normotensive   Lipid management Home meds:  none LDL 53, goal < 70 High intensity statin not indicated d/t LDL within goal on no treatment     Dysphagia Patient has post-stroke dysphagia, SLP consulted       Diet    Diet NPO time specified        Advance diet as tolerated   Other Stroke Risk Factors Advanced Age Coronary artery disease Former smoker   Other Active Problems CKD 3B Cr 2.21->2.30->2.11-1.75 -1.74-1.62-1.54  Ethics/social After multiple discussions with different providers since admission in regards to poor prognosis and the likelihood of her not making a meaningful recovery post stroke is very poor.  After the family had discussions amongst themselves they all opted to pursue compassionate extubation and focus on comfort care. Patient was extubated and patient expired on April 02, 2024 @ 2121.    Pertinent Labs  and Studies  Significant Diagnostic Studies CT HEAD WO CONTRAST ( ) Result Date: 03/20/2024 EXAM: CT HEAD WITHOUT CONTRAST 03/20/2024 09:52:39 AM TECHNIQUE: CT of the head was performed without the administration of intravenous contrast. Automated exposure control, iterative reconstruction, and/or weight based adjustment of the mA/kV was utilized to reduce the radiation dose to as low as reasonably achievable. COMPARISON: Head CT 03/18/2024 and earlier. CLINICAL HISTORY: 89 year old female. Stroke, follow up. FINDINGS: BRAIN AND VENTRICLES: Large heterogeneous, mixed density left hemisphere infarct with areas of confluence hyperdense blood intracranial mass effect with partially effaced left lateral ventricle and rightward midline shift of 5 mm stable. Small volume of intraventricular hemorrhage is stable. Basilar cisterns remain normal. Stable gray white differentiation throughout the brain. No new cortically based infarct. No new intracranial hemorrhage. Advanced calcified atherosclerosis again noted at the skull base. No extra-axial collection. No hydrocephalus. ORBITS: No acute abnormality. SINUSES: Paranasal sinus fluid levels and bubbly opacity. SOFT TISSUES AND SKULL: Intubated on scout view. Tympanic cavities and mastoids remain well aerated. No acute soft tissue abnormality. No skull fracture. IMPRESSION: 1. Stable since 03/18/24: 2. Heterogeneous left MCA infarct with intra-axial blood and small volume IVH. Stable mass effect, 5 mm rightward midline shift. 3. No new intracranial abnormality. Electronically signed by: Helayne Hurst MD 03/20/2024 10:20 AM EDT RP Workstation: [id number]   DG Abd Portable 1V Result Date: 03/19/2024 CLINICAL DATA:  [id number] Encounter for orogastric (OG) tube placement [id number] EXAM: PORTABLE ABDOMEN - 1 VIEW COMPARISON:  X-ray abdomen 03/14/2024 FINDINGS: Enteric tube courses below the hemidiaphragm with tip and side port overlying  the expected region of the gastric lumen. The  bowel gas pattern is normal. No radio-opaque calculi or other significant radiographic abnormality are seen. Cardiomegaly with Amplatzer device noted. IMPRESSION: Enteric tube in good position. Electronically Signed   By: Morgane  Naveau M.D.   On: 03/19/2024 21:15   CT HEAD WO CONTRAST ( ) Result Date: 03/18/2024 CLINICAL DATA:  Provided history: Stroke, follow-up. Hemorrhagic stroke, follow-up. EXAM: CT HEAD WITHOUT CONTRAST TECHNIQUE: Contiguous axial images were obtained from the base of the skull through the vertex without intravenous contrast. RADIATION DOSE REDUCTION: This exam was performed according to the departmental dose-optimization program which includes automated exposure control, adjustment of the mA and/or kV according to patient size and/or use of iterative reconstruction technique. COMPARISON:  Head CT 03/16/2024.  Brain MRI 03/15/2024. FINDINGS: Brain: Generalized cerebral atrophy. Known large evolving left middle cerebral artery territory infarct. Hyperdensity within portions of the infarction territory has not significantly changed as compared to the head CT of 03/16/2024. This could reflect contrast staining and/or hemorrhage. Hemorrhage within/along the temporal horn of the left lateral ventricle, and within the occipital horn of the left little ventricle, has undergone some interval redistribution but has not significantly changed in extent. Unchanged mass with unchanged partial effacement of the left lateral ventricle. As before, there is 5 mm rightward midline shift. New trace subarachnoid hemorrhage along the left occipital lobe, possibly due to interval redistribution. Known small recent infarcts within the left cerebellar hemisphere were better appreciated on the prior brain MRI of 03/15/2024. Background chronic small vessel ischemic disease, unchanged. Vascular: No hyperdense vessel. Atherosclerotic calcifications. Skull: No calvarial fracture or aggressive osseous lesion.  Sinuses/Orbits: No orbital mass or acute orbital finding. Other: Moderate mucosal thickening within the right maxillary sinus at the imaged levels. Moderate right sphenoid sinusitis. Mild mucosal thickening within the left sphenoid sinus. IMPRESSION: 1. Known large evolving left middle cerebral artery territory infarct. Contrast staining and/or hemorrhage within portions of the infarction territory has not significantly changed since the head CT of 03/16/2024. 2. Hemorrhage within/along the temporal horn of the left lateral ventricle, and within the occipital horn of the left lateral ventricle, has also not significantly changed in extent. 3. Unchanged mass effect with partial effacement of the left lateral ventricle and 5 mm rightward midline shift. 4. New trace subarachnoid hemorrhage along the left occipital lobe, possibly due to interval redistribution. 5. Known small recent infarcts within the left cerebellar hemisphere were better appreciated on the prior brain MRI of 03/15/2024. 6. Background parenchymal atrophy and chronic small vessel ischemic disease. 7. Paranasal sinus disease at the imaged levels, as described. Electronically Signed   By: Rockey Childs D.O.   On: 03/18/2024 14:23   CT HEAD WO CONTRAST ( ) Result Date: 03/16/2024 EXAM: CT HEAD WITHOUT CONTRAST 03/16/2024 01:09:28 AM TECHNIQUE: CT of the head was performed without the administration of intravenous contrast. Automated exposure control, iterative reconstruction, and/or weight based adjustment of the mA/kV was utilized to reduce the radiation dose to as low as reasonably achievable. COMPARISON: 03/14/2024 CLINICAL HISTORY: Stroke, follow up. FINDINGS: BRAIN AND VENTRICLES: Decreasing hyperdensity in the left basal ganglia compared to 03/14/2024. There is a new hyperdense focus near the inferior aspect of the left lentiform nucleus since the 10/01 CT but it appears unchanged compared to the 10/01 MRI. There is persistent mass effect on the  left lateral ventricle with minimal rightward midline shift. Blood within the left lateral ventricle atrium and occipital horn has increased. Chronic ischemic white matter changes and  generalized volume loss. No evidence of acute infarct. No hydrocephalus. No extra-axial collection. ORBITS: No acute abnormality. SINUSES: No acute abnormality. SOFT TISSUES AND SKULL: No acute soft tissue abnormality. No skull fracture. IMPRESSION: 1. Increased intraventricular blood within the left lateral ventricle atrium and occipital horn. No hydrocephalus. 2. Decreasing density of the area of suspected parenchymal contrast staining in the left basal ganglia, with unchanged mass effect on the left lateral ventricle with minimal rightward midline shift. 3. Hyperdensity at the inferior aspect of the left lentiform nucleus is hemorrhage that is new compared to the 10/1 CT, but unchanged compared to the MRI later that day. Electronically signed by: Franky Stanford MD 03/16/2024 01:20 AM EDT RP Workstation: [id number]   DG CHEST PORT 1 VIEW Result Date: 03/15/2024 CLINICAL DATA:  Stroke. EXAM: PORTABLE CHEST 1 VIEW COMPARISON:  03/14/2024 FINDINGS: The endotracheal tube terminates approximately 2.0 cm above the carina. The enteric tube extends below the diaphragm, and is incompletely visualized. Unchanged sternotomy wires. The lungs are well aerated and expanded. No pleural effusion or pneumothorax. Unchanged enlargement of the cardiac silhouette. Prosthetic cardiac valve. IMPRESSION: No significant interval change. Electronically Signed   By: Reyes Honer M.D.   On: 03/15/2024 17:02   MR BRAIN WO CONTRAST Result Date: 03/15/2024 EXAM: MRI BRAIN WITHOUT CONTRAST 03/15/2024 12:19:49 AM TECHNIQUE: Multiplanar multisequence MRI of the head/brain was performed without the administration of intravenous contrast. COMPARISON: None available. CLINICAL HISTORY: Neuro deficit, acute, stroke suspected. FINDINGS: BRAIN AND VENTRICLES:  Widespread cortical acute/early subacute ischemia throughout the left MCA territory, also affecting the basal ganglia. Additionally, multifocal acute/early subacute ischemia within the left cerebellum. Areas of magnetic susceptibility effect within the left MCA territory may be due to contrast staining. Mass effect on the left lateral ventricle and 4 mm of rightward midline shift. 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 Multifocal hyperintense T2-weighted signal within the cerebral white matter, most commonly due to chronic small vessel disease. No mass. No hydrocephalus. The sella is unremarkable. Normal flow voids. ORBITS: Ocular lens replacements. No acute abnormality. SINUSES AND MASTOIDS: Moderate right maxillary sinus mucosal thickening. Small amount of fluid in the sphenoid sinuses. No acute abnormality. BONES AND SOFT TISSUES: Normal marrow signal. No acute soft tissue abnormality. IMPRESSION: 1. Multifocal acute/early subacute ischemia within the left MCA territory and left cerebellum. 2. Mass effect on the left lateral ventricle and 4 mm of rightward midline shift. 3. Areas of magnetic susceptibility within the left MCA territory may reflect contrast staining. Subarachnoid hemorrhage could have the same appearance. Continued follow-up by head CT will be helpful to differentiate. 4. 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 Multifocal hyperintense T2-weighted signal within the cerebral white matter, most commonly due to chronic small vessel disease. Electronically signed by: Franky Stanford MD 03/15/2024 12:57 AM EDT RP Workstation: [id number]   ECHOCARDIOGRAM COMPLETE Result Date: 03/14/2024    ECHOCARDIOGRAM REPORT   Patient Name:   OLANDA BOUGHNER Date of Exam: 03/14/2024 Medical Rec #:  [id number]         Height:       65.0 in Accession #:    [id number]        Weight:       115.0 lb Date of Birth:  06/18/1935          BSA:          1.564 m Patient Age:    88 years          BP:  136/52 mmHg  Patient Gender: F                 HR:           63 bpm. Exam Location:  Inpatient Procedure: 2D Echo (Both Spectral and Color Flow Doppler were utilized during            procedure). Indications:    Stroke  History:        Patient has no prior history of Echocardiogram examinations.                  Mitral Valve: mechanical valve valve is present in the mitral                 position.  Sonographer:    Charmaine Gaskins Referring Phys: [phone] ERIC LINDZEN IMPRESSIONS  1. Left ventricular ejection fraction, by estimation, is 60 to 65%. The left ventricle has normal function. The left ventricle has no regional wall motion abnormalities. Left ventricular diastolic function could not be evaluated.  2. Right ventricular systolic function is mildly reduced. The right ventricular size is normal. There is mildly elevated pulmonary artery systolic pressure. The estimated right ventricular systolic pressure is 43.2 mmHg.  3. Left atrial size was severely dilated.  4. Right atrial size was severely dilated.  5. The mitral valve has been repaired/replaced. Trivial mitral valve regurgitation. No evidence of mitral stenosis. The mean mitral valve gradient is 3.0 mmHg with average heart rate of 60 bpm. There is a mechanical valve present in the mitral position.  Echo findings are consistent with normal structure and function of the mitral valve prosthesis.  6. Tricuspid valve regurgitation is moderate.  7. There is a mobile, 4 mm-long filamentous echodensity attached to the left coronary cusp of the aortic valve. This could represent a Lambl's excrescence (normal finding), but it appears thicker than that structure usually appears. There is associated mild aortic insufficiency and a vegetation should be considered. The aortic valve is tricuspid. Aortic valve regurgitation is mild. No aortic stenosis is present. Comparison(s): No prior Echocardiogram. Conclusion(s)/Recommendation(s): Consider TEE if there is clinical suspicion for  endocarditis. FINDINGS  Left Ventricle: Left ventricular ejection fraction, by estimation, is 60 to 65%. The left ventricle has normal function. The left ventricle has no regional wall motion abnormalities. The left ventricular internal cavity size was normal in size. There is  no left ventricular hypertrophy. Abnormal (paradoxical) septal motion consistent with post-operative status. Left ventricular diastolic function could not be evaluated due to mitral valve replacement. Left ventricular diastolic function could not be evaluated. Right Ventricle: The right ventricular size is normal. No increase in right ventricular wall thickness. Right ventricular systolic function is mildly reduced. There is mildly elevated pulmonary artery systolic pressure. The tricuspid regurgitant velocity  is 2.88 m/s, and with an assumed right atrial pressure of 10 mmHg, the estimated right ventricular systolic pressure is 43.2 mmHg. Left Atrium: Left atrial size was severely dilated. Right Atrium: Right atrial size was severely dilated. Pericardium: There is no evidence of pericardial effusion. Mitral Valve: The mitral valve has been repaired/replaced. Trivial mitral valve regurgitation. There is a mechanical valve present in the mitral position. Echo findings are consistent with normal structure and function of the mitral valve prosthesis. No evidence of mitral valve stenosis. MV peak gradient, 12.5 mmHg. The mean mitral valve gradient is 3.0 mmHg with average heart rate of 60 bpm. Tricuspid Valve: The tricuspid valve is normal in structure. Tricuspid valve regurgitation is moderate.  Aortic Valve: There is a mobile, 4 mm-long filamentous echodensity attached to the left coronary cusp of the aortic valve. This could represent a Lambl's excrescence (normal finding), but it appears thicker than that structure usually appears. There is associated mild aortic insufficiency and a vegetation should be considered. The aortic valve is  tricuspid. Aortic valve regurgitation is mild. No aortic stenosis is present. Pulmonic Valve: The pulmonic valve was normal in structure. Pulmonic valve regurgitation is mild. No evidence of pulmonic stenosis. Aorta: The aortic root and ascending aorta are structurally normal, with no evidence of dilitation. Venous: IVC assessment for right atrial pressure unable to be performed due to mechanical ventilation. IAS/Shunts: There is right bowing of the interatrial septum, suggestive of elevated left atrial pressure. No atrial level shunt detected by color flow Doppler.  LEFT VENTRICLE PLAX 2D LVIDd:         3.50 cm   Diastology LVIDs:         2.00 cm   LV e' medial:    6.64 cm/s LV PW:         0.90 cm   LV E/e' medial:  19.8 LV IVS:        0.90 cm   LV e' lateral:   8.27 cm/s LVOT diam:     2.00 cm   LV E/e' lateral: 15.9 LV SV:         81 LV SV Index:   52 LVOT Area:     3.14 cm  RIGHT VENTRICLE RV Basal diam:  3.70 cm RV Mid diam:    3.50 cm RV S prime:     8.27 cm/s TAPSE (M-mode): 1.8 cm LEFT ATRIUM              Index         RIGHT ATRIUM           Index LA diam:        6.80 cm  4.35 cm/m    RA Area:     53.40 cm LA Vol (A2C):   201.0 ml 128.55 ml/m  RA Volume:   234.00 ml 149.66 ml/m LA Vol (A4C):   243.0 ml 155.41 ml/m LA Biplane Vol: 239.0 ml 152.86 ml/m  AORTIC VALVE LVOT Vmax:   93.00 cm/s LVOT Vmean:  66.750 cm/s LVOT VTI:    0.258 m  AORTA Ao Root diam: 3.60 cm Ao Asc diam:  2.80 cm MITRAL VALVE                TRICUSPID VALVE MV Area (PHT): 3.10 cm     TR Peak grad:   33.2 mmHg MV Area VTI:   1.77 cm     TR Vmax:        288.00 cm/s MV Peak grad:  12.5 mmHg MV Mean grad:  3.0 mmHg     SHUNTS MV Vmax:       1.76 m/s     Systemic VTI:  0.26 m MV Vmean:      76.9 cm/s    Systemic Diam: 2.00 cm MV Decel Time: 245 msec MV E velocity: 131.50 cm/s Jerel Balding MD Electronically signed by Jerel Balding MD Signature Date/Time: 03/14/2024/5:10:17 PM    Final    EEG adult Result Date: 03/14/2024 Shelton Arlin KIDD, MD     03/14/2024  5:00 PM Patient Name: YANIQUE MULVIHILL MRN: [id number] Epilepsy Attending: Arlin KIDD Shelton Referring Physician/Provider: Armenta Canning, MD Date: 03/14/2024 Duration: 22.55 mins Patient history:  89 y.o.  female with history of CAD, HTN, mechanical heart valve on Coumadin and stroke who presented after being found unresponsive at home by her husband. EEG to evaluate for seizure Level of alertness:  comatose AEDs during EEG study: LEV, propofol Technical aspects: This EEG study was done with scalp electrodes positioned according to the 10-20 International system of electrode placement. Electrical activity was reviewed with band pass filter of 1-70Hz , sensitivity of 7 uV/mm, display speed of 15mm/sec with a 60Hz  notched filter applied as appropriate. EEG data were recorded continuously and digitally stored.  Video monitoring was available and reviewed as appropriate. Description: EEG showed continuous generalized 3 to 6 Hz theta-delta slowing admixed with 13-14hz  beta activity distributed symmetrically and diffusely.  Hyperventilation and photic stimulation were not performed.   ABNORMALITY - Continuous slow, generalized IMPRESSION: This study is suggestive of moderate to severe diffuse encephalopathy. No seizures or epileptiform discharges were seen throughout the recording. Arlin MALVA Krebs   DG Abd Portable 1V Result Date: 03/14/2024 CLINICAL DATA:  Nasogastric tube placement. EXAM: DG ABD PORTABLE 1V COMPARISON:  None Available. FINDINGS: Tip and side port of the enteric tube below the diaphragm in the stomach. Excreted IV contrast in both renal collecting systems. Left lung base opacity and possible effusion. The heart is enlarged. IMPRESSION: Tip and side port of the enteric tube below the diaphragm in the stomach. Electronically Signed   By: Andrea Gasman M.D.   On: 03/14/2024 16:25   CT HEAD POST STROKE FOLLOWUP/TIMED/STAT READ Result Date: 03/14/2024 CLINICAL DATA:  Code  stroke. Neuro deficit, acute, stroke suspected. EXAM: CT HEAD WITHOUT CONTRAST TECHNIQUE: Contiguous axial images were obtained from the base of the skull through the vertex without intravenous contrast. RADIATION DOSE REDUCTION: This exam was performed according to the departmental dose-optimization program which includes automated exposure control, adjustment of the mA and/or kV according to patient size and/or use of iterative reconstruction technique. COMPARISON:  Non-contrast head CT and CT angiogram head/neck 03/14/2024. Report from catheter based angiography 03/14/2024. FINDINGS: Brain: Generalized cerebral and cerebellar atrophy. Ill-defined hyperdensity within the basal ganglia region, insula and frontotemporal operculum on the left. The appearance is most suggestive of enhancement/contrast staining. Associated mass effect at these sites consistent with an underlying acute left MCA territory infarct. Partial effacement of the left lateral ventricle. 3 mm rightward midline shift. There is residual intravascular contrast. Within this limitation, no definite subarachnoid hemorrhage is identified. Background patchy and ill-defined hypoattenuation within the cerebral white matter, nonspecific but compatible with chronic small vessel ischemic disease. No evidence of an intracranial mass. Vascular: Atherosclerotic calcifications. Residual circulating intravascular contrast limits evaluation for hyperdense vessels. Asymmetrically prominent vascular enhancement within the left MCA vascular territory, suggesting slow flow. Skull: No calvarial fracture or aggressive osseous lesion. Sinuses/Orbits: No mass or acute finding within the imaged orbits. Moderate-sized fluid level within the right maxillary sinus. Small fluid levels within the bilateral sphenoid sinuses. Small-volume fluid scattered within bilateral mastoid air cells. Impressions #1, #2 and #3 will be called to the ordering clinician or representative by the  Radiologist Assistant, and communication documented in the PACS or [employer]. IMPRESSION: 1. Ill-defined hyperdensity within the basal ganglia region, insula and frontotemporal operculum on the left. The appearance is most suggestive of enhancement/contrast staining. Associated mass effect at these sites consistent with an underlying acute left MCA territory infarct. Partial effacement of the left lateral ventricle. 3 mm rightward midline shift. 2. There is residual circulating intravascular contrast. Within this limitation, no definite subarachnoid hemorrhage is  identified. 3. Asymmetrically prominent vascular enhancement within the left MCA vascular territory, suggesting slow flow. 4. Background parenchymal atrophy and chronic small vessel ischemic disease. 5. Paranasal sinus disease as described. Electronically Signed   By: Rockey Childs D.O.   On: 03/14/2024 16:18   IR PERCUTANEOUS ART THROMBECTOMY/INFUSION INTRACRANIAL INC DIAG ANGIO Result Date: 03/14/2024 PROCEDURE PERFORMED: 1. Cerebral angiography with stroke thrombectomy 2. Ultrasound guided vascular access 3. 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 Last known well was proximally 10 p.m. last night. Initial NIH stroke scale on presentation was 33. INDICATION: Acute ischemic stroke. ANESTHESIA/SEDATION: General anesthesia was utilized for the procedure. CONTRAST:  Approximately 65 cc Ominipque 300 MEDICATIONS: See MAR FLUOROSCOPY TIME:  Fluoroscopy Time: 11.9 minutes, (722 mGy). COMPLICATIONS: None immediate. BODY OF REPORT: Following a full explanation of the procedure along with the potential associated complications, an informed witnessed consent was obtained. The patient was then placed under general anesthesia by the Department of  Anesthesiology at [facility]. The right femoral access site was prepped and draped in the usual sterile fashion. Ultrasound was used to study the right common femoral artery which was patent. Using real-time ultrasound guidance, a 19 gauge introducer needle was used to access the right common femoral artery. Access was performed at 12:13. A hard copy image ultrasound the saved and stored in PACS. Using this access, a 6 French sheath was placed in the descending thoracic aorta. Next, selective catheterization the left common carotid artery was performed. A selective arteriogram was performed which demonstrated that the left internal carotid artery and left external carotid arteries are occluded. Pretreatment TICI score 0. Next, red 72 catheter was used to selectively catheterize the left internal carotid artery. The first pass was performed at 1226. Additional passes were performed at 12:28 and 12:30 and passes extended into the carotid terminus. A large volume of thrombus was retrieved. A post treatment arteriogram with catheter positioned in the left internal carotid artery demonstrated restoration of antegrade flow through the internal carotid artery into the left ACA and MCA territories. There is no significant residual embolus or slow flow appreciated. Hyperemia was noted within the basal ganglia. Post treatment TICI score 3. I elected at this point to evaluate the vertebral artery collateral circulation as the left vertebral artery was likely occluded. Next, diagnostic catheter was advanced into the left subclavian artery and a selective arteriogram was performed to profiled the right vertebral artery origin. Next, selective catheterization of the right vertebral artery was performed. A selective arteriogram demonstrated at the right vertebral artery was patent. The basilar artery was patent. Opacification of the right PCA was achieved. Inflow was favored from the left PCA via the anterior circulation.  After reviewing the imaging, I elected to terminate the procedure at this point. Evaluation of the right femoral access site demonstrated that the site was suitable for a closure device. A 6 French Angio-Seal device was deployed without complication. Cone beam CT was then performed to evaluate for intracranial hemorrhage and treatment planning. This demonstrated contrast agent staining within the left basal ganglia and deep white matter. At this point, the patient was transferred to the recovery area remaining mechanically intubated secondary to severity of medical condition. IMPRESSION: 1. 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 2. Diagnostic arteriogram demonstrates suspected embolic  occlusion of the left ECA which provides flow to the left vertebral artery. 3. Diagnostic arteriogram demonstrates a patent right vertebral artery with intact vertebrobasilar system. PLAN: 1. To ICU for routine postoperative supportive care. Electronically Signed   By: Maude Naegeli M.D.   On: 03/14/2024 14:23   IR ANGIO VERTEBRAL SEL SUBCLAVIAN INNOMINATE UNI L MOD SED Result Date: 03/14/2024 PROCEDURE PERFORMED: 1. Cerebral angiography with stroke thrombectomy 2. Ultrasound guided vascular access 3. 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 Last known well was proximally 10 p.m. last night. Initial NIH stroke scale on presentation was 33. INDICATION: Acute ischemic stroke. ANESTHESIA/SEDATION: General anesthesia was utilized for the procedure. CONTRAST:  Approximately 65 cc Ominipque 300 MEDICATIONS: See MAR FLUOROSCOPY TIME:  Fluoroscopy Time: 11.9 minutes, (722 mGy). COMPLICATIONS: None immediate. BODY OF REPORT: Following a full explanation of the procedure along with the potential associated  complications, an informed witnessed consent was obtained. The patient was then placed under general anesthesia by the Department of Anesthesiology at [facility]. The right femoral access site was prepped and draped in the usual sterile fashion. Ultrasound was used to study the right common femoral artery which was patent. Using real-time ultrasound guidance, a 19 gauge introducer needle was used to access the right common femoral artery. Access was performed at 12:13. A hard copy image ultrasound the saved and stored in PACS. Using this access, a 6 French sheath was placed in the descending thoracic aorta. Next, selective catheterization the left common carotid artery was performed. A selective arteriogram was performed which demonstrated that the left internal carotid artery and left external carotid arteries are occluded. Pretreatment TICI score 0. Next, red 72 catheter was used to selectively catheterize the left internal carotid artery. The first pass was performed at 1226. Additional passes were performed at 12:28 and 12:30 and passes extended into the carotid terminus. A large volume of thrombus was retrieved. A post treatment arteriogram with catheter positioned in the left internal carotid artery demonstrated restoration of antegrade flow through the internal carotid artery into the left ACA and MCA territories. There is no significant residual embolus or slow flow appreciated. Hyperemia was noted within the basal ganglia. Post treatment TICI score 3. I elected at this point to evaluate the vertebral artery collateral circulation as the left vertebral artery was likely occluded. Next, diagnostic catheter was advanced into the left subclavian artery and a selective arteriogram was performed to profiled the right vertebral artery origin. Next, selective catheterization of the right vertebral artery was performed. A selective arteriogram demonstrated at the right vertebral artery was patent. The  basilar artery was patent. Opacification of the right PCA was achieved. Inflow was favored from the left PCA via the anterior circulation. After reviewing the imaging, I elected to terminate the procedure at this point. Evaluation of the right femoral access site demonstrated that the site was suitable for a closure device. A 6 French Angio-Seal device was deployed without complication. Cone beam CT was then performed to evaluate for intracranial hemorrhage and treatment planning. This demonstrated contrast agent staining within the left basal ganglia and deep white matter. At this point, the patient was transferred to the recovery area remaining mechanically intubated secondary to severity of medical condition. IMPRESSION: 1. 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  2. Diagnostic arteriogram demonstrates suspected embolic occlusion of the left ECA which provides flow to the left vertebral artery. 3. Diagnostic arteriogram demonstrates a patent right vertebral artery with intact vertebrobasilar system. PLAN: 1. To ICU for routine postoperative supportive care. Electronically Signed   By: Maude Naegeli M.D.   On: 03/14/2024 14:23   IR ANGIO VERTEBRAL SEL VERTEBRAL UNI R MOD SED Result Date: 03/14/2024 PROCEDURE PERFORMED: 1. Cerebral angiography with stroke thrombectomy 2. Ultrasound guided vascular access 3. 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 Last known well was proximally 10 p.m. last night. Initial NIH stroke scale on presentation was 33. INDICATION: Acute ischemic stroke. ANESTHESIA/SEDATION: General anesthesia was utilized for the procedure. CONTRAST:  Approximately 65 cc Ominipque 300 MEDICATIONS: See MAR FLUOROSCOPY TIME:  Fluoroscopy Time: 11.9 minutes, (722 mGy).  COMPLICATIONS: None immediate. BODY OF REPORT: Following a full explanation of the procedure along with the potential associated complications, an informed witnessed consent was obtained. The patient was then placed under general anesthesia by the Department of Anesthesiology at [facility]. The right femoral access site was prepped and draped in the usual sterile fashion. Ultrasound was used to study the right common femoral artery which was patent. Using real-time ultrasound guidance, a 19 gauge introducer needle was used to access the right common femoral artery. Access was performed at 12:13. A hard copy image ultrasound the saved and stored in PACS. Using this access, a 6 French sheath was placed in the descending thoracic aorta. Next, selective catheterization the left common carotid artery was performed. A selective arteriogram was performed which demonstrated that the left internal carotid artery and left external carotid arteries are occluded. Pretreatment TICI score 0. Next, red 72 catheter was used to selectively catheterize the left internal carotid artery. The first pass was performed at 1226. Additional passes were performed at 12:28 and 12:30 and passes extended into the carotid terminus. A large volume of thrombus was retrieved. A post treatment arteriogram with catheter positioned in the left internal carotid artery demonstrated restoration of antegrade flow through the internal carotid artery into the left ACA and MCA territories. There is no significant residual embolus or slow flow appreciated. Hyperemia was noted within the basal ganglia. Post treatment TICI score 3. I elected at this point to evaluate the vertebral artery collateral circulation as the left vertebral artery was likely occluded. Next, diagnostic catheter was advanced into the left subclavian artery and a selective arteriogram was performed to profiled the right vertebral artery origin. Next, selective catheterization of  the right vertebral artery was performed. A selective arteriogram demonstrated at the right vertebral artery was patent. The basilar artery was patent. Opacification of the right PCA was achieved. Inflow was favored from the left PCA via the anterior circulation. After reviewing the imaging, I elected to terminate the procedure at this point. Evaluation of the right femoral access site demonstrated that the site was suitable for a closure device. A 6 French Angio-Seal device was deployed without complication. Cone beam CT was then performed to evaluate for intracranial hemorrhage and treatment planning. This demonstrated contrast agent staining within the left basal ganglia and deep white matter. At this point, the patient was transferred to the recovery area remaining mechanically intubated secondary to severity of medical condition. IMPRESSION: 1. Suction thrombectomy of  left internal carotid artery occlusion. 2. Diagnostic arteriogram demonstrates suspected embolic occlusion of the left ECA which provides flow to the left vertebral artery. 3. Diagnostic arteriogram demonstrates a patent right vertebral artery with intact vertebrobasilar system. PLAN: 1. To ICU for routine postoperative supportive care. Electronically Signed   By: Maude Naegeli M.D.   On: 03/14/2024 14:23   IR CT Head Ltd Result Date: 03/14/2024 PROCEDURE PERFORMED: 1. Cerebral angiography with stroke thrombectomy 2. Ultrasound guided vascular access 3. 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 Last known well was proximally 10 p.m. last night. Initial NIH stroke scale on presentation was 33. INDICATION: Acute ischemic stroke. ANESTHESIA/SEDATION: General anesthesia was utilized for the procedure. CONTRAST:  Approximately 65 cc Ominipque  300 MEDICATIONS: See MAR FLUOROSCOPY TIME:  Fluoroscopy Time: 11.9 minutes, (722 mGy). COMPLICATIONS: None immediate. BODY OF REPORT: Following a full explanation of the procedure along with the potential associated complications, an informed witnessed consent was obtained. The patient was then placed under general anesthesia by the Department of Anesthesiology at [facility]. The right femoral access site was prepped and draped in the usual sterile fashion. Ultrasound was used to study the right common femoral artery which was patent. Using real-time ultrasound guidance, a 19 gauge introducer needle was used to access the right common femoral artery. Access was performed at 12:13. A hard copy image ultrasound the saved and stored in PACS. Using this access, a 6 French sheath was placed in the descending thoracic aorta. Next, selective catheterization the left common carotid artery was performed. A selective arteriogram was performed which demonstrated that the left internal carotid artery and left external carotid arteries are occluded. Pretreatment TICI score 0. Next, red 72 catheter was used to selectively catheterize the left internal carotid artery. The first pass was performed at 1226. Additional passes were performed at 12:28 and 12:30 and passes extended into the carotid terminus. A large volume of thrombus was retrieved. A post treatment arteriogram with catheter positioned in the left internal carotid artery demonstrated restoration of antegrade flow through the internal carotid artery into the left ACA and MCA territories. There is no significant residual embolus or slow flow appreciated. Hyperemia was noted within the basal ganglia. Post treatment TICI score 3. I elected at this point to evaluate the vertebral artery collateral circulation as the left vertebral artery was likely occluded. Next, diagnostic catheter was advanced into the left subclavian artery and a selective arteriogram was  performed to profiled the right vertebral artery origin. Next, selective catheterization of the right vertebral artery was performed. A selective arteriogram demonstrated at the right vertebral artery was patent. The basilar artery was patent. Opacification of the right PCA was achieved. Inflow was favored from the left PCA via the anterior circulation. After reviewing the imaging, I elected to terminate the procedure at this point. Evaluation of the right femoral access site demonstrated that the site was suitable for a closure device. A 6 French Angio-Seal device was deployed without complication. Cone beam CT was then performed to evaluate for intracranial hemorrhage and treatment planning. This demonstrated contrast agent staining within the left basal ganglia and deep white matter. At this point, the patient was transferred to the recovery area remaining mechanically intubated secondary to severity of medical condition. IMPRESSION: 1. Suction thrombectomy of  left internal carotid artery occlusion. 2. Diagnostic arteriogram demonstrates suspected embolic occlusion of the left ECA which provides flow to the left vertebral artery. 3. Diagnostic arteriogram demonstrates a patent right vertebral artery with intact vertebrobasilar system. PLAN: 1. To ICU for routine postoperative supportive care. Electronically Signed   By: Maude Naegeli M.D.   On: 03/14/2024 14:23   CT ANGIO HEAD NECK W WO CM W PERF (CODE STROKE) Result Date: 03/14/2024 CLINICAL DATA:  89 year old female found down unresponsive. Left MCA cytotoxic edema and hyperdense distal left ICA on head CT. EXAM: CT ANGIOGRAPHY HEAD AND NECK CT PERFUSION BRAIN TECHNIQUE: Multidetector CT imaging of the head and neck was performed using the standard protocol during bolus administration of intravenous contrast. Multiplanar CT image reconstructions and MIPs were obtained to evaluate the vascular anatomy. Carotid stenosis measurements (when applicable) are  obtained utilizing NASCET criteria, using the distal internal carotid diameter as the denominator. Multiphase CT imaging of the brain was performed following IV bolus contrast injection. Subsequent parametric perfusion maps were calculated using RAPID software. RADIATION DOSE REDUCTION: This exam was performed according to the departmental dose-optimization program which includes automated exposure control, adjustment of the mA and/or kV according to patient size and/or use of iterative reconstruction technique. CONTRAST:  100mL OMNIPAQUE IOHEXOL 350 MG/ML SOLN COMPARISON:  Head CT 1053 hours today, cervical spine CT today. FINDINGS: CT Brain Perfusion Findings: ASPECTS: 5 CBF (<30%) Volume: Zero, but strongly suspected to be underestimated, possible pseudo normalization. Perfusion (Tmax>6.0s) volume: Mismatch Volume: Indeterminate Infarction Location:Left MCA CTA NECK Skeleton: Cervical spine detailed separately today. Previous sternotomy. No acute osseous abnormality identified. Upper chest: Intubated with endotracheal tube terminating in satisfactory position above the carina. Some retained secretions distal to the tube at the carina and in the both mainstem bronchi. Enteric tube courses in the visible esophagus. Upper lungs well aerated. Other neck: Intubated and oral enteric tube in place. Fluid in the pharynx. Other nonvascular neck soft tissue spaces appear negative. Aortic arch: Calcified aortic atherosclerosis.  3 vessel arch. Right carotid system: Patent with tortuosity and mild atherosclerosis. No stenosis to the skull base. Left carotid system: Left CCA origin calcified plaque without stenosis. Tortuous left CCA at the thoracic inlet. Patent left carotid bifurcation, mild to moderate calcified plaque more affecting the left ECA. Loss of enhancement distal left ICA below the skull base (series 8 image 87). See intracranial findings below. Vertebral arteries: Proximal subclavian arteries patent with  plaque but no significant stenosis. Right vertebral artery origin is patent with adjacent plaque but no significant stenosis. Diminutive right vertebral artery with a late entry into the cervical transverse foramen (series 5, image 227). Right vertebral is patent to the skull base without stenosis. The left vertebral artery arises from the left external carotid circulation. Origin demonstrated on series 10, image 164. There is some calcified atherosclerosis of the proximal vessel, which has a dominant size, and remains patent to the skull base with no extracranial stenosis. Only a tiny left vertebral artery remnant present in the cervical transverse foramina (series 5, image 206). See intracranial findings below. CTA HEAD Posterior circulation: Left vertebral artery V4 segment is occluded (series 5, image 150). Highly diminutive right vertebral V4 segment distal to the patent right PICA origin, and stenotic proximal to the right vertebrobasilar junction. However, the basilar artery remains patent. Patent basilar tip. Patent PCA origins. Tortuous left P1. Patent but diminutive posterior communicating arteries. Bilateral PCA branches are patent with only mild irregularity. Anterior circulation: Occluded left  ICA siphon. Reconstituted left posterior communicating artery, but no significant left ICA reconstitution to the terminus. T occlusion appearance there (series 12, image 40). Faintly reconstituted left ACA A1 appears to be via the anterior communicating artery. Faintly reconstituted left MCA branches. Contralateral right ICA siphon is patent with calcified plaque and mild to moderate cavernous segment stenosis. Patent right ICA terminus. Patent right MCA and ACA origins without stenosis. Anterior communicating artery and bilateral ACA branches are within normal limits. Right MCA M1 segment and right MCA trifurcation are patent. Right MCA branches appear within normal limits. Venous sinuses: Early contrast timing,  not well evaluated. Anatomic variants: Left vertebral artery appears to be dominant and also arises from the external carotid artery on that side. Review of the MIP images confirms the above findings IMPRESSION: 1. 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 Left ICA T-occlusion appearance intracranially, minimal reconstitution of the left MCA and left ACA A1. 2. CT Perfusion does not detect Left MCA territory infarct core, although abnormal Left hemisphere ASPECTS still strongly suspected. Possible pseudonormalization. Large 179 mL volume of oligemia in the left hemisphere by CTP. 3. Dominant Left Vertebral Artery with unusual origin of the Left ECA, occluded in the V4 segment. Tenuous basilar artery patency maintained by at non dominant right vertebral artery which is stenotic in the V4 segment. Small posterior communicating arteries are present. 4. Left ACA A2 segment successfully reconstituted by the anterior communicating artery. 5.  Aortic Atherosclerosis (ICD10-I70.0). #1 initially communicated to Dr. Lindzen at 1145 hours on 03/14/2024 by text page via the [facility] messaging system. Electronically Signed   By: VEAR Hurst M.D.   On: 03/14/2024 12:02   DG Chest Portable 1 View Result Date: 03/14/2024 CLINICAL DATA:  Status post intubation EXAM: PORTABLE CHEST 1 VIEW COMPARISON:  None Available. FINDINGS: Endotracheal tube in place with tip 2.6 cm from the carina. Partially imaged enteric tube with tip outside the field of view. No focal consolidation. No pleural effusions. No pneumothorax. Marked cardiomegaly.  Intact sternotomy wires. IMPRESSION: Endotracheal tube in place. Marked cardiomegaly. Electronically Signed   By: Michaeline Blanch M.D.   On: 03/14/2024 11:43   CT Head Wo Contrast Addendum Date: 03/14/2024 ADDENDUM REPORT: 03/14/2024 11:32 ADDENDUM: Study discussed by telephone with Dr. LUDIVINA PFEIFFER on 03/14/2024 at 1123 hours. Electronically Signed   By: VEAR Hurst M.D.   On: 03/14/2024 11:32    Result Date: 03/14/2024 CLINICAL DATA:  89 year old female found down. Unresponsive. Intubated. EXAM: CT HEAD WITHOUT CONTRAST TECHNIQUE: Contiguous axial images were obtained from the base of the skull through the vertex without intravenous contrast. RADIATION DOSE REDUCTION: This exam was performed according to the departmental dose-optimization program which includes automated exposure control, adjustment of the mA and/or kV according to patient size and/or use of iterative reconstruction technique. COMPARISON:  None Available. FINDINGS: Brain: Basal ganglia vascular calcifications. 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 Chronic appearing infarcts in the right superior cerebellar artery territory, bilateral PICA territory. Cytotoxic edema in the left hemisphere, most apparent in the left temporal lobe. But asymmetric loss of gray-white differentiation in the left insula and deep gray matter also. ASPECTS five. No hemorrhagic transformation. No midline shift. Basilar cisterns remain patent. No other acute cerebral edema identified. Vascular: Hyperdense left ICA terminus and left MCA origin (series 6, image 36). Calcified atherosclerosis at the skull base.  Skull: No acute osseous abnormality identified. Calvarium appears intact. Sinuses/Orbits: Intubated with mild paranasal sinus fluid and mucosal thickening. Tympanic cavities and mastoids remain well aerated. Other: Intubated with fluid in the pharynx. No acute orbit or scalp soft tissue finding. IMPRESSION: 1. Strong evidence of ELVO: Hyperdense Left ICA terminus and MCA, with fairly widespread cytotoxic edema in the Left MCA territory. ASPECTS 5. No hemorrhagic transformation or midline shift. 2. Underlying chronic ischemic disease in the right corona radiata, right basal ganglia, right  greater than left cerebellum. Electronically Signed: By: VEAR Hurst M.D. On: 03/14/2024 11:18   CT Cervical Spine Wo Contrast Result Date: 03/14/2024 CLINICAL DATA:  89 year old female found down. Unresponsive. Intubated. EXAM: CT CERVICAL SPINE WITHOUT CONTRAST TECHNIQUE: Multidetector CT imaging of the cervical spine was performed without intravenous contrast. Multiplanar CT image reconstructions were also generated. RADIATION DOSE REDUCTION: This exam was performed according to the departmental dose-optimization program which includes automated exposure control, adjustment of the mA and/or kV according to patient size and/or use of iterative reconstruction technique. COMPARISON:  Head CT today. FINDINGS: Alignment: Straightening of cervical lordosis. No significant scoliosis or spondylolisthesis. Cervicothoracic junction alignment is within normal limits. Bilateral posterior element alignment is within normal limits. Skull base and vertebrae: Visualized skull base is intact. No atlanto-occipital dissociation. C1 and C2 appear chronically degenerated, intact and aligned. No acute osseous abnormality identified. Soft tissues and spinal canal: No prevertebral fluid or swelling. No visible canal hematoma. Endotracheal and enteric tubes in place with appropriate visible course into the airway and cervical esophagus, respectively. Calcified carotid atherosclerosis in the neck. Disc levels: Chronic cervical spine degeneration. No high-grade cervical spinal stenosis by CT. Upper chest: Visible upper thoracic levels appear intact. Negative lung apices. IMPRESSION: 1. No acute traumatic injury identified in the cervical spine. 2. Satisfactory visible course of endotracheal and enteric tubes. 3. Chronic cervical spine degeneration. Electronically Signed   By: VEAR Hurst M.D.   On: 03/14/2024 11:20    Microbiology Recent Results (from the past 240 hours)  MRSA Next Gen by PCR, Nasal     Status: None   Collection Time:  03/14/24  3:37 PM   Specimen: Nasal Mucosa; Nasal Swab  Result Value Ref Range Status   MRSA by PCR Next Gen NOT DETECTED NOT DETECTED Final    Comment: (NOTE) The GeneXpert MRSA Assay (FDA approved for NASAL specimens only), is one component of a comprehensive MRSA colonization surveillance program. It is not intended to diagnose MRSA infection nor to guide or monitor treatment for MRSA infections. Test performance is not FDA approved in patients less than 73 years old. Performed at [facility], [address] [place], [place] [address]     Lab Basic Metabolic Panel: Recent Labs  Lab 03/16/24 0559 03/16/24 1428 03/17/24 0600 03/18/24 0442 03/19/24 0628 03/20/24 0354 03-Apr-2024 0317  NA 163*   < > 165* 161* 150* 145 140  K 4.5   < > 4.1 4.0 3.6 4.0 3.9  CL >130*   < > >130* 127* 120* 114* 109  CO2 20*   < > 21* 22 22 25 23   GLUCOSE 134*   < > 133* 116* 134* 139* 137*  BUN 26*   < > 28* 28* 27* 25* 26*  CREATININE 1.75*   < > 1.89* 1.74* 1.62* 1.54* 1.37*  CALCIUM 8.5*   < > 8.7* 8.8* 8.3* 8.6* 8.3*  MG 2.0  --  2.0 2.1  --  2.0 2.1  PHOS 3.0  --  2.0* 3.7  --  3.9 3.5   < > = values in this interval not displayed.   Liver Function Tests: No results for input(s): AST, ALT, ALKPHOS, BILITOT, PROT, ALBUMIN in the last 168 hours. No results for input(s): LIPASE, AMYLASE in the last 168 hours. No results for input(s): AMMONIA in the last 168 hours. CBC: Recent Labs  Lab 03/16/24 0559 03/17/24 0600 03/18/24 0442 03/19/24 0628 03/20/24 1737  WBC 9.2 9.4 8.2 6.0 5.4  HGB 9.0* 9.1* 9.2* 8.0* 8.2*  HCT 29.4* 30.5* 30.7* 26.1* 26.6*  MCV 110.5* 113.0* 113.7* 111.5* 110.4*  PLT 132* 141*  142* 136* 108* 112*   Cardiac Enzymes: No results for input(s): CKTOTAL, CKMB, CKMBINDEX, TROPONINI in the last 168 hours. Sepsis Labs: Recent Labs  Lab 03/15/24 1930 03/16/24 0559 03/16/24 0559 03/17/24 0600 03/18/24 0442 03/19/24 0628  03/20/24 1737  WBC  --  9.2   < > 9.4 8.2 6.0 5.4  LATICACIDVEN 2.3* 2.0*  --   --   --   --   --    < > = values in this interval not displayed.    Karna DELENA Geralds 03/22/2024, 2:57 PM  I have personally obtained history,examined this patient, reviewed notes, independently viewed imaging studies, participated in medical decision making and plan of care.ROS completed by me personally and pertinent positives fully documented  I have made any additions or clarifications directly to the above note. Agree with note above.    Eather Popp, MD Medical Director [facility] Pager: [phone] 03/25/2024 8:59 AM

## 2024-04-14 DEATH — deceased

## 2024-04-18 LAB — ECHO TEE
